# Patient Record
Sex: Female | Born: 1980 | Race: White | Hispanic: No | Marital: Married | Smoking: Never smoker
Health system: Southern US, Community
[De-identification: ages and names within clinical notes are randomized; demographics above are authoritative.]

## PROBLEM LIST (undated history)

## (undated) DIAGNOSIS — K5090 Crohn's disease, unspecified, without complications: Secondary | ICD-10-CM

## (undated) DIAGNOSIS — D689 Coagulation defect, unspecified: Secondary | ICD-10-CM

---

## 2012-07-03 DIAGNOSIS — K5090 Crohn's disease, unspecified, without complications: Secondary | ICD-10-CM | POA: Insufficient documentation

## 2014-04-07 ENCOUNTER — Emergency Department: Payer: Self-pay | Admitting: Emergency Medicine

## 2014-04-07 LAB — COMPREHENSIVE METABOLIC PANEL
ALT: 13 U/L (ref 12–78)
ANION GAP: 6 — AB (ref 7–16)
AST: 15 U/L (ref 15–37)
Albumin: 2.6 g/dL — ABNORMAL LOW (ref 3.4–5.0)
Alkaline Phosphatase: 72 U/L
BUN: 7 mg/dL (ref 7–18)
Bilirubin,Total: 0.2 mg/dL (ref 0.2–1.0)
CHLORIDE: 107 mmol/L (ref 98–107)
CO2: 23 mmol/L (ref 21–32)
Calcium, Total: 8.3 mg/dL — ABNORMAL LOW (ref 8.5–10.1)
Creatinine: 0.62 mg/dL (ref 0.60–1.30)
Glucose: 81 mg/dL (ref 65–99)
Osmolality: 269 (ref 275–301)
Potassium: 3.7 mmol/L (ref 3.5–5.1)
SODIUM: 136 mmol/L (ref 136–145)
Total Protein: 6.8 g/dL (ref 6.4–8.2)

## 2014-04-07 LAB — CBC WITH DIFFERENTIAL/PLATELET
Basophil #: 0 10*3/uL (ref 0.0–0.1)
Basophil %: 0.2 %
Eosinophil #: 0 10*3/uL (ref 0.0–0.7)
Eosinophil %: 0.2 %
HCT: 32.8 % — ABNORMAL LOW (ref 35.0–47.0)
HGB: 10.6 g/dL — AB (ref 12.0–16.0)
Lymphocyte #: 1 10*3/uL (ref 1.0–3.6)
Lymphocyte %: 11.6 %
MCH: 25.8 pg — ABNORMAL LOW (ref 26.0–34.0)
MCHC: 32.4 g/dL (ref 32.0–36.0)
MCV: 80 fL (ref 80–100)
MONOS PCT: 7.3 %
Monocyte #: 0.7 x10 3/mm (ref 0.2–0.9)
NEUTROS ABS: 7.2 10*3/uL — AB (ref 1.4–6.5)
Neutrophil %: 80.7 %
Platelet: 165 10*3/uL (ref 150–440)
RBC: 4.12 10*6/uL (ref 3.80–5.20)
RDW: 14 % (ref 11.5–14.5)
WBC: 9 10*3/uL (ref 3.6–11.0)

## 2014-04-07 LAB — URINALYSIS, COMPLETE
Bilirubin,UR: NEGATIVE
Blood: NEGATIVE
Glucose,UR: NEGATIVE mg/dL (ref 0–75)
KETONE: NEGATIVE
Nitrite: NEGATIVE
Ph: 5 (ref 4.5–8.0)
Protein: 30
RBC,UR: 1 /HPF (ref 0–5)
Specific Gravity: 1.023 (ref 1.003–1.030)

## 2014-04-07 LAB — LIPASE, BLOOD: Lipase: 196 U/L (ref 73–393)

## 2014-05-28 ENCOUNTER — Observation Stay: Payer: Self-pay | Admitting: Obstetrics and Gynecology

## 2014-05-28 LAB — CBC WITH DIFFERENTIAL/PLATELET
BANDS NEUTROPHIL: 3 %
Basophil #: 0 10*3/uL (ref 0.0–0.1)
Basophil %: 0.4 %
EOS ABS: 0 10*3/uL (ref 0.0–0.7)
EOS PCT: 0.5 %
HCT: 31 % — AB (ref 35.0–47.0)
HGB: 9.7 g/dL — ABNORMAL LOW (ref 12.0–16.0)
LYMPHS ABS: 0.9 10*3/uL — AB (ref 1.0–3.6)
LYMPHS PCT: 13.5 %
Lymphocytes: 14 %
MCH: 23.4 pg — ABNORMAL LOW (ref 26.0–34.0)
MCHC: 31.2 g/dL — AB (ref 32.0–36.0)
MCV: 75 fL — AB (ref 80–100)
Monocyte #: 0.5 x10 3/mm (ref 0.2–0.9)
Monocyte %: 7.2 %
Monocytes: 9 %
NEUTROS PCT: 78.4 %
Neutrophil #: 5.4 10*3/uL (ref 1.4–6.5)
Platelet: 122 10*3/uL — ABNORMAL LOW (ref 150–440)
RBC: 4.12 10*6/uL (ref 3.80–5.20)
RDW: 15.2 % — AB (ref 11.5–14.5)
Segmented Neutrophils: 74 %
WBC: 6.9 10*3/uL (ref 3.6–11.0)

## 2014-05-28 LAB — URINALYSIS, COMPLETE
BLOOD: NEGATIVE
Bacteria: NONE SEEN
Bilirubin,UR: NEGATIVE
Glucose,UR: NEGATIVE mg/dL (ref 0–75)
Ketone: NEGATIVE
Nitrite: NEGATIVE
Ph: 6 (ref 4.5–8.0)
Protein: NEGATIVE
Specific Gravity: 1.018 (ref 1.003–1.030)
Squamous Epithelial: 9

## 2014-05-28 LAB — COMPREHENSIVE METABOLIC PANEL
ALBUMIN: 2.6 g/dL — AB (ref 3.4–5.0)
ALT: 13 U/L (ref 12–78)
ANION GAP: 8 (ref 7–16)
AST: 10 U/L — AB (ref 15–37)
Alkaline Phosphatase: 123 U/L — ABNORMAL HIGH
BILIRUBIN TOTAL: 0.3 mg/dL (ref 0.2–1.0)
BUN: 7 mg/dL (ref 7–18)
Calcium, Total: 8.6 mg/dL (ref 8.5–10.1)
Chloride: 108 mmol/L — ABNORMAL HIGH (ref 98–107)
Co2: 24 mmol/L (ref 21–32)
Creatinine: 0.6 mg/dL (ref 0.60–1.30)
EGFR (Non-African Amer.): >60
GLUCOSE: 96 mg/dL (ref 65–99)
OSMOLALITY: 277 (ref 275–301)
POTASSIUM: 3.7 mmol/L (ref 3.5–5.1)
Sodium: 140 mmol/L (ref 136–145)
Total Protein: 6.7 g/dL (ref 6.4–8.2)

## 2014-05-30 LAB — URINE CULTURE

## 2014-06-03 ENCOUNTER — Ambulatory Visit: Payer: Self-pay | Admitting: Obstetrics and Gynecology

## 2014-06-03 LAB — CBC WITH DIFFERENTIAL/PLATELET
BASOS ABS: 0 10*3/uL (ref 0.0–0.1)
BASOS PCT: 0.3 %
Eosinophil #: 0.1 10*3/uL (ref 0.0–0.7)
Eosinophil %: 0.7 %
HCT: 31.7 % — ABNORMAL LOW (ref 35.0–47.0)
HGB: 9.9 g/dL — ABNORMAL LOW (ref 12.0–16.0)
LYMPHS ABS: 1.2 10*3/uL (ref 1.0–3.6)
Lymphocyte %: 15.9 %
MCH: 23.3 pg — AB (ref 26.0–34.0)
MCHC: 31.1 g/dL — ABNORMAL LOW (ref 32.0–36.0)
MCV: 75 fL — ABNORMAL LOW (ref 80–100)
MONO ABS: 0.7 x10 3/mm (ref 0.2–0.9)
Monocyte %: 8.8 %
NEUTROS PCT: 74.3 %
Neutrophil #: 5.7 10*3/uL (ref 1.4–6.5)
Platelet: 108 10*3/uL — ABNORMAL LOW (ref 150–440)
RBC: 4.24 10*6/uL (ref 3.80–5.20)
RDW: 15.3 % — AB (ref 11.5–14.5)
WBC: 7.7 10*3/uL (ref 3.6–11.0)

## 2014-06-04 ENCOUNTER — Inpatient Hospital Stay: Payer: Self-pay | Admitting: Obstetrics and Gynecology

## 2014-06-05 LAB — HEMATOCRIT: HCT: 27.9 % — AB (ref 35.0–47.0)

## 2014-06-06 LAB — PATHOLOGY REPORT

## 2015-03-08 NOTE — Op Note (Signed)
PATIENT NAME:  Taylor Hammond, Taylor Hammond MR#:  [id number] DATE OF BIRTH:  08-21-1981  DATE OF PROCEDURE:  06/04/2014  PREOPERATIVE DIAGNOSES:  1. A 39.0 week intrauterine pregnancy, undelivered.  2. History of previous cesarean section delivery.  3. Crohn disease.   POSTOPERATIVE DIAGNOSES:  1. A 39.0 week intrauterine pregnancy, delivered.  2. History of previous cesarean section delivery.  3. Crohn disease.  4. A viable female infant; 7 pounds, 13 ounces.  5. Leiomyoma uteri.   OPERATIVE PROCEDURES: Repeat low cervical transverse cesarean section and myomectomy.   SURGEON: Taylor Hammond. Taylor Hammond, M.D.   FIRST ASSISTANT: Taylor Lou, FNP.   ANESTHESIA: Spinal.   INDICATIONS: The patient is a 34 year old, married white female, gravida 2, para 1-0-0-1, at 39.0 weeks with an EDC of 06/11/2014, presents for repeat cesarean section delivery. Prenatal course had been notable for history of previous cesarean section delivery due to failure to progress. She also had a history of Crohn disease, which was stable during this pregnancy. CT scan previously done during pregnancy because of chest pain and shortness of breath to rule out pulmonary embolus.   FINDINGS AT SURGERY: Revealed a multifibroid uterus with at least 5 subserosal fibroids being seen in the uterine fundus and posterior lower uterine segment. Each of these fibroids was approximately 2 cm in diameter. There was an isolated subserosal fibroid near the uterine incision, which was excised.   DESCRIPTION OF PROCEDURE: The patient was brought to the operating room where she was placed in the sitting position. Spinal anesthetic was introduced without difficulty. She was placed in the supine position with a right lateral hip roll in place. A Foley catheter was placed. A ChloraPrep abdominal preparation and drape was performed in standard fashion. After checking for adequate level of anesthesia and following a timeout, the previous skin incision scar  was excised. The fascia was incised in the midline and extended bilaterally with Mayo scissors. Midline raphe was identified, incised and the peritoneum was entered. A bladder flap was created over the lower uterine segment through sharp dissection. Moderate scarring was encountered. The low transverse incision was made in the uterus and this was extended bilaterally, and with anterior and posterior retraction, the incision was enlarged. The infant was then delivered through a vertex presentation and was vigorous at birth. Oropharynx and nasopharynx were bulb suctioned on the anterior abdominal wall. Delivery was retained. The umbilical cord was doubly clamped and cut and the infant was handed off to the awaiting resuscitation team. The placenta was expressed from the uterine cavity. The uterus was externalized onto the anterior abdominal wall. It was cleared of all debris with laps.   The incision was closed in 2 layers using #1 chromic suture. The first layer was a running locking stitch. The second layer was an imbricating layer. The tubes and ovaries appeared grossly normal. Adhesions between the posterior lower uterine segment fibroids and omentum were taken down with the Bovie cautery. The lower uterine segment 3 cm fibroid, submucosa was resected near the uterine incision. A 2-0 chromic suture was used to optimize hemostasis. Once satisfied with hemostasis, the uterus was placed back into the abdominopelvic cavity. Gutters were cleared of all debris with laps. The incision was closed in layers using 0 Maxon in the fashion of simple running manner. The subcutaneous tissues were reapproximated using 2-0 Vicryl. The skin was closed with a subcuticular stitch of 4-0 Vicryl. Dermabond glue was placed over the incision and a pressure dressing was applied. A Lidoderm patch was also  applied. The patient was then awakened, mobilized, and taken to the recovery room in satisfactory condition.   ESTIMATED BLOOD LOSS:  500 mL.   INTRAVENOUS FLUIDS: 1200 mL.   URINE OUTPUT: 150 mL.  COUNTS: All instruments, needle, and sponge counts were verified as correct.   The patient did receive Ancef antibiotic prophylaxis.    ____________________________ Taylor Hammond Taylor Mowatt, MD mad:jr D: 06/04/2014 14:43:55 ET T: 06/04/2014 15:29:04 ET JOB#: [id number]  cc: Taylor Deutscher A. Pattricia Weiher, MD, <Dictator> [facility] Taylor Hammond Taylor Nierman MD ELECTRONICALLY SIGNED 06/05/2014 6:12

## 2015-03-19 ENCOUNTER — Ambulatory Visit
Admission: EM | Admit: 2015-03-19 | Discharge: 2015-03-19 | Disposition: A | Discharge status: Home / Self Care | Payer: No Typology Code available for payment source | Attending: Family Medicine | Admitting: Family Medicine

## 2015-03-19 DIAGNOSIS — M7662 Achilles tendinitis, left leg: Secondary | ICD-10-CM | POA: Diagnosis not present

## 2015-03-19 DIAGNOSIS — M722 Plantar fascial fibromatosis: Secondary | ICD-10-CM | POA: Diagnosis not present

## 2015-03-19 HISTORY — DX: Crohn's disease, unspecified, without complications: K50.90

## 2015-03-19 MED ORDER — MELOXICAM 15 MG PO TABS: 15.0000 mg | ORAL_TABLET | Freq: Every day | ORAL | Status: DC

## 2015-03-19 NOTE — ED Notes (Signed)
Patient states that she has left foot pain with swollen and tender in the heel. She states that this has been going on for a while but worsened last night.

## 2015-03-19 NOTE — Discharge Instructions (Signed)
Achilles Tendinitis Achilles tendinitis is inflammation of the tough, cord-like band that attaches the lower muscles of your leg to your heel (Achilles tendon). It is usually caused by overusing the tendon and joint involved.  CAUSES Achilles tendinitis can happen because of:  A sudden increase in exercise or activity (such as running).  Doing the same exercises or activities (such as jumping) over and over.  Not warming up calf muscles before exercising.  Exercising in shoes that are worn out or not made for exercise.  Having arthritis or a bone growth on the back of the heel bone. This can rub against the tendon and hurt the tendon. SIGNS AND SYMPTOMS The most common symptoms are:  Pain in the back of the leg, just above the heel. The pain usually gets worse with exercise and better with rest.  Stiffness or soreness in the back of the leg, especially in the morning.  Swelling of the skin over the Achilles tendon.  Trouble standing on tiptoe. Sometimes, an Achilles tendon tears (ruptures). Symptoms of an Achilles tendon rupture can include:  Sudden, severe pain in the back of the leg.  Trouble putting weight on the foot or walking normally. DIAGNOSIS Achilles tendinitis will be diagnosed based on symptoms and a physical examination. An X-ray may be done to check if another condition is causing your symptoms. An MRI may be ordered if your health care provider suspects you may have completely torn your tendon, which is called an Achilles tendon rupture.  TREATMENT  Achilles tendinitis usually gets better over time. It can take weeks to months to heal completely. Treatment focuses on treating the symptoms and helping the injury heal. HOME CARE INSTRUCTIONS   Rest your Achilles tendon and avoid activities that cause pain.  Apply ice to the injured area:  Put ice in a plastic bag.  Place a towel between your skin and the bag.  Leave the ice on for 20 minutes, 2-3 times a  day  Try to avoid using the tendon (other than gentle range of motion) while the tendon is painful. Do not resume use until instructed by your health care provider. Then begin use gradually. Do not increase use to the point of pain. If pain does develop, decrease use and continue the above measures. Gradually increase activities that do not cause discomfort until you achieve normal use.  Do exercises to make your calf muscles stronger and more flexible. Your health care provider or physical therapist can recommend exercises for you to do.  Wrap your ankle with an elastic bandage or other wrap. This can help keep your tendon from moving too much. Your health care provider will show you how to wrap your ankle correctly.  Only take over-the-counter or prescription medicines for pain, discomfort, or fever as directed by your health care provider. SEEK MEDICAL CARE IF:   Your pain and swelling increase or pain is uncontrolled with medicines.  You develop new, unexplained symptoms or your symptoms get worse.  You are unable to move your toes or foot.  You develop warmth and swelling in your foot.  You have an unexplained temperature. MAKE SURE YOU:   Understand these instructions.  Will watch your condition.  Will get help right away if you are not doing well or get worse. Document Released: 08/11/2005 Document Revised: 08/22/2013 Document Reviewed: 06/13/2013 [employer] Patient Information 2015 [employer]. This information is not intended to replace advice given to you by your health care provider. Make sure you discuss   any questions you have with your health care provider.  

## 2015-03-19 NOTE — ED Provider Notes (Signed)
CSN: [id number]     Arrival date & time 03/19/15  1214 History   First MD Initiated Contact with Patient 03/19/15 1320     Chief Complaint  Patient presents with  . Foot Pain   (Consider location/radiation/quality/duration/timing/severity/associated sxs/prior Treatment) Patient is a 34 y.o. female presenting with lower extremity pain. The history is provided by the patient.  Foot Pain This is a new problem. The current episode started more than 1 week ago. The problem occurs constantly. The problem has been rapidly worsening. The symptoms are aggravated by walking and standing. Nothing relieves the symptoms. She has tried a cold compress and rest for the symptoms.   Presents with left foot and Achille's tendon pain radiating into her knee posteriorly. She has been exercising hard to get into shape since delivering her 2nd child. She had been exercising for 3 weeks when she began to have the pain. About 3 days ago she bought insoles which seemed to help at first, but last night it was worse. Now limping.  She stopped breast feeding 3 days ago. She is not planning to return to breast feeding. Past Medical History  Diagnosis Date  . Crohn's disease    Past Surgical History  Procedure Laterality Date  . Cesarean section      X 2   No family history on file. History  Substance Use Topics  . Smoking status: Never Smoker   . Smokeless tobacco: Not on file  . Alcohol Use: No   OB History    No data available     Review of Systems  All other systems reviewed and are negative.   Allergies  Codeine and Dairy aid  Home Medications   Prior to Admission medications   Medication Sig Start Date End Date Taking? Authorizing Provider  meloxicam (MOBIC) 15 MG tablet Take 1 tablet (15 mg total) by mouth daily. 03/19/15   Chrissie Noa Roemer, PA-C   BP 123/79 mmHg  Pulse 97  Temp(Src) 98.5 F (36.9 C) (Tympanic)  Resp 18  Ht  (1.753 m)  Wt 195 lb (88.451 kg)  BMI 28.78 kg/m2  SpO2 97%   LMP 03/10/2015 Physical Exam  Constitutional: She is oriented to person, place, and time. She appears well-developed and well-nourished.  HENT:  Head: Normocephalic and atraumatic.  Eyes: EOM are normal. Pupils are equal, round, and reactive to light.  Neck: Normal range of motion. Neck supple.  Musculoskeletal:  Left leg is mildly swollen. Maximal tenderness is sharply localized over the Achille's insertion and the plantar fascia along the Flexor hallux. Mild swelling of popliteal fossa..   Neurological: She is alert and oriented to person, place, and time. She has normal reflexes.  Skin: Skin is warm and dry.  Psychiatric: She has a normal mood and affect. Her behavior is normal. Judgment and thought content normal.  Nursing note and vitals reviewed.   ED Course  Procedures (including critical care time) Labs Review Labs Reviewed - No data to display  Imaging Review No results found.   MDM   1. Achilles tendinitis of left lower extremity   2. Plantar fasciitis of left foot        Lutricia Feil, PA-C 03/19/15 1408

## 2015-07-09 ENCOUNTER — Encounter: Payer: Self-pay | Admitting: Emergency Medicine

## 2015-07-09 ENCOUNTER — Ambulatory Visit
Admission: EM | Admit: 2015-07-09 | Discharge: 2015-07-09 | Disposition: A | Discharge status: Home / Self Care | Payer: No Typology Code available for payment source | Attending: Family Medicine | Admitting: Family Medicine

## 2015-07-09 DIAGNOSIS — M2550 Pain in unspecified joint: Secondary | ICD-10-CM

## 2015-07-09 DIAGNOSIS — R21 Rash and other nonspecific skin eruption: Secondary | ICD-10-CM

## 2015-07-09 LAB — COMPREHENSIVE METABOLIC PANEL
ALK PHOS: 68 U/L (ref 38–126)
ALT: 20 U/L (ref 14–54)
AST: 23 U/L (ref 15–41)
Albumin: 4.3 g/dL (ref 3.5–5.0)
Anion gap: 7 (ref 5–15)
BUN: 17 mg/dL (ref 6–20)
CALCIUM: 9.2 mg/dL (ref 8.9–10.3)
CO2: 28 mmol/L (ref 22–32)
CREATININE: 0.67 mg/dL (ref 0.44–1.00)
Chloride: 104 mmol/L (ref 101–111)
GFR calc non Af Amer: >60 mL/min (ref >60)
Glucose, Bld: 93 mg/dL (ref 65–99)
Potassium: 4 mmol/L (ref 3.5–5.1)
SODIUM: 139 mmol/L (ref 135–145)
Total Bilirubin: 0.3 mg/dL (ref 0.3–1.2)
Total Protein: 8.3 g/dL — ABNORMAL HIGH (ref 6.5–8.1)

## 2015-07-09 LAB — CBC WITH DIFFERENTIAL/PLATELET
BASOS ABS: 0.1 10*3/uL (ref 0–0.1)
Basophils Relative: 1 %
EOS ABS: 0.1 10*3/uL (ref 0–0.7)
EOS PCT: 3 %
HCT: 34.5 % — ABNORMAL LOW (ref 35.0–47.0)
HEMOGLOBIN: 10.7 g/dL — AB (ref 12.0–16.0)
LYMPHS ABS: 1 10*3/uL (ref 1.0–3.6)
Lymphocytes Relative: 18 %
MCH: 21.6 pg — AB (ref 26.0–34.0)
MCHC: 30.9 g/dL — ABNORMAL LOW (ref 32.0–36.0)
MCV: 69.8 fL — ABNORMAL LOW (ref 80.0–100.0)
Monocytes Absolute: 0.7 10*3/uL (ref 0.2–0.9)
Monocytes Relative: 12 %
NEUTROS PCT: 66 %
Neutro Abs: 3.8 10*3/uL (ref 1.4–6.5)
Platelets: 218 10*3/uL (ref 150–440)
RBC: 4.94 MIL/uL (ref 3.80–5.20)
RDW: 16.9 % — ABNORMAL HIGH (ref 11.5–14.5)
WBC: 5.7 10*3/uL (ref 3.6–11.0)

## 2015-07-09 LAB — SEDIMENTATION RATE: Sed Rate: 58 mm/hr — ABNORMAL HIGH (ref 0–20)

## 2015-07-09 LAB — C-REACTIVE PROTEIN: CRP: 2.2 mg/dL — ABNORMAL HIGH (ref <1.0)

## 2015-07-09 LAB — URIC ACID: Uric Acid, Serum: 4.5 mg/dL (ref 2.3–6.6)

## 2015-07-09 MED ORDER — TRAMADOL HCL 50 MG PO TABS: 50.0000 mg | ORAL_TABLET | Freq: Three times a day (TID) | ORAL | Status: DC | PRN

## 2015-07-09 MED ORDER — DOXYCYCLINE HYCLATE 100 MG PO CAPS: 100.0000 mg | ORAL_CAPSULE | Freq: Two times a day (BID) | ORAL | Status: DC

## 2015-07-09 NOTE — ED Provider Notes (Signed)
[facility] Emergency Department Provider Note  ____________________________________________  Time seen: Approximately 10:47 AM  I have reviewed the triage vital signs and the nursing notes.   HISTORY  Chief Complaint Joint Pain   HPI Taylor Hammond is a 34 y.o. female presents for the complaints of joint pain. Patient reports that for the last 4 months she has had intermittent joint pain in her feet, ankles, knees, hands, wrist. Patient states that often she has pain in other joints as well, stating "all my joints". Patient reports that she was diagnosed with plantar fasciitis approximate 4 months ago and states that that has improved but she continues with joint pain. States that when she had plantar fasciitis she was taking Mobic which helped. States that she had stopped taking the mobic for a few months. States 3 weeks ago she took another Mobic which helped with her pain but states that the next morning she awoke with a rash. States that the rash started out like hives and was generalized. Patient states that the rash was itchy. States that after she stopped taking the Mobic the rash stopped appearing. States she tends to pick at scabs which is why she still has some areas on her forearms and legs. Denies new rash since stopping mobic 2 weeks ago.   States continues with joint pains. States today pain is primarily in hands and feet and knees. States occasionally feels like joints are swollen but does not appear to be swollen. Denies fever, fall, injury, recent sickness. Denies recent known tick or insect bites, but states has pulled multiple ticks off her dogs. Denies known tick or insect bites.  Reports she continues to remain active. States other than the pain she feels well. Reports continues to eat and drink well. States current pain is rated at 4 out of 10 and described as aching. States she takes Aleve daily which helps with pain.  Denies fall or injury.      Past Medical History  Diagnosis Date  . Crohn's disease   Anemia (reports vitamins with iron at home, states has not been taking daily)  There are no active problems to display for this patient.   Past Surgical History  Procedure Laterality Date  . Cesarean section      X 2    Current Outpatient Rx  Name  Route  Sig  Dispense  Refill  . meloxicam (MOBIC) 15 MG tablet   Oral   Take 1 tablet (15 mg total) by mouth daily.   30 tablet   0    Last Menstrual: 2 weeks ago. States not pregnant and not currently sexually active.   Allergies Codeine and Dairy aid  History reviewed. No pertinent family history.  Social History Social History  Substance Use Topics  . Smoking status: Never Smoker   . Smokeless tobacco: None  . Alcohol Use: 0.0 oz/week    0 Standard drinks or equivalent per week    Review of Systems Constitutional: No fever/chills Eyes: No visual changes. ENT: No sore throat. Cardiovascular: Denies chest pain. Respiratory: Denies shortness of breath. Gastrointestinal: No abdominal pain.  No nausea, no vomiting.  No diarrhea.  No constipation. Denies dark stools, bloody or tarry stools.  Genitourinary: Negative for dysuria. Musculoskeletal: Negative for back pain.positive for generalized joint pains.  Skin: positive for rash as above.  Neurological: Negative for headaches, focal weakness or numbness.Denies dizziness.   10-point ROS otherwise negative.  ____________________________________________   PHYSICAL EXAM:  VITAL SIGNS: ED  Triage Vitals  Enc Vitals Group     BP 07/09/15 1015 110/72 mmHg     Pulse Rate 07/09/15 1015 90     Resp 07/09/15 1015 18     Temp 07/09/15 1015 97.9 F (36.6 C)     Temp Source 07/09/15 1015 Tympanic     SpO2 07/09/15 1015 99 %     Weight 07/09/15 1015 197 lb (89.359 kg)     Height 07/09/15 1015 5\' 9"  (1.753 m)     Head Cir --      Peak Flow --      Pain Score 07/09/15 1017 9     Pain Loc --      Pain Edu?  --      Excl. in GC? --     Constitutional: Alert and oriented. Well appearing and in no acute distress. Eyes: Conjunctivae are normal. PERRL. EOMI. Head: Atraumatic.  Nose: No congestion/rhinnorhea.  Mouth/Throat: Mucous membranes are moist.  Oropharynx non-erythematous. Neck: No stridor.  No cervical spine tenderness to palpation. Hematological/Lymphatic/Immunilogical: No cervical lymphadenopathy. Cardiovascular: Normal rate, regular rhythm. Grossly normal heart sounds.  Good peripheral circulation. Respiratory: Normal respiratory effort.  No retractions. Lungs CTAB. Gastrointestinal: Soft and nontender. No distention. Normal Bowel sounds.  No abdominal bruits. No CVA tenderness. Musculoskeletal: No lower or upper extremity tenderness nor edema.  No joint effusions. Bilateral pedal pulses equal and easily palpated. 5/5 strength to bilateral upper and lower extremities. Full ROM. Motor and sensation intact to bilateral upper and lower extremities. No edema to upper or lower extremities noted. Steady Gait. Changes positions from lying to standing quickly without difficulty or distress. Right foot first MCP mild TTP, right hand second and third MCP mild TTP. No erythema, no signs of infection.  Neurologic:  Normal speech and language. No gross focal neurologic deficits are appreciated. No gait instability. Skin:  Skin is warm, dry and intact. Bilateral forearms and lower anterior bilateral legs with multiple nonpruritic, non erythematic rash and scaring. Rash is macular small  Psychiatric: Mood and affect are normal. Speech and behavior are normal.  ____________________________________________   LABS (all labs ordered are listed, but only abnormal results are displayed)  Labs Reviewed  CBC WITH DIFFERENTIAL/PLATELET - Abnormal; Notable for the following:    Hemoglobin 10.7 (*)    HCT 34.5 (*)    MCV 69.8 (*)    MCH 21.6 (*)    MCHC 30.9 (*)    RDW 16.9 (*)    All other components  within normal limits  COMPREHENSIVE METABOLIC PANEL - Abnormal; Notable for the following:    Total Protein 8.3 (*)    All other components within normal limits  SEDIMENTATION RATE - Abnormal; Notable for the following:    Sed Rate 58 (*)    All other components within normal limits  URIC ACID  ROCKY MTN SPOTTED FVR ABS PNL(IGG+IGM)  C-REACTIVE PROTEIN  B. BURGDORFI ANTIBODIES   _________________________________________   INITIAL IMPRESSION / ASSESSMENT AND PLAN / ED COURSE  Pertinent labs & imaging results that were available during my care of the patient were reviewed by me and considered in my medical decision making (see chart for details).  Very well appearing patient. No acute distress. Presents for the complaints of 4 months of intermittent varying joint pain. Patient reports that it varies between upper and lower extremities. Denies neck or back pain. Denies fall or injury. Denies fever or headache. Patient reports that she feels well other than intermittent pain. Patient also  reports rash onset 3 weeks ago which is now resolving. Patient states that no new rash or lesions in 2 weeks. States she thinks rash was due to Mobic and will avoid Mobic . Denies continuing pruritus. Denies known tick bites or insect bites, but states that she has pulled ticks off of her dogs.   Discussed patient and plan of care with Dr. Concha Se who also saw patient and reviewed results. Chronic anemia, per patient similar hemoglobin level as her past. Elevated sed rate, concern for autoimmune disorder such as rheumatoid arthritis. Patient also with potential tick exposure and with complaint of recent rash and arthralgias, will send for tick related labs and treat with doxycycline. Discussed and counseled regarding skin photosensitivity and pregnancy risks with doxycycline. PRN tramadol for pain.Encouraged patient to continued home multivitamins with iron and follow up with PCP. Discussed very strict follow up  and return parameters. Patient to follow up with PCP next week as well as follow up with rheumatology next week. Discussed strict return parameters. Patient verbalized understanding and agreed to plan. Discussed go to ER for fever, increased pain, rash or other concerns.     ____________________________________________   FINAL CLINICAL IMPRESSION(S) / ED DIAGNOSES  Final diagnoses:  Joint pain  Rash       Renford Dills, NP 07/09/15 1342   Renford Dills, NP 07/09/15 1349

## 2015-07-09 NOTE — Discharge Instructions (Signed)
Take medication as prescribed. Rest. Drink plenty of fluids.  As discussed, take daily vitamins with iron and eat healthy diet. Follow up with your primary care physician next week and for further follow-up of your chronic anemia.  Also as discussed follow-up within 1 week with rheumatology. See above to call today to schedule.  Return to the urgent care or go to the ER for fever, increased pain, new or worsening concerns.  Arthralgia Your caregiver has diagnosed you as suffering from an arthralgia. Arthralgia means there is pain in a joint. This can come from many reasons including:  Bruising the joint which causes soreness (inflammation) in the joint.  Wear and tear on the joints which occur as we grow older (osteoarthritis).  Overusing the joint.  Various forms of arthritis.  Infections of the joint. Regardless of the cause of pain in your joint, most of these different pains respond to anti-inflammatory drugs and rest. The exception to this is when a joint is infected, and these cases are treated with antibiotics, if it is a bacterial infection. HOME CARE INSTRUCTIONS   Rest the injured area for as long as directed by your caregiver. Then slowly start using the joint as directed by your caregiver and as the pain allows. Crutches as directed may be useful if the ankles, knees or hips are involved. If the knee was splinted or casted, continue use and care as directed. If an stretchy or elastic wrapping bandage has been applied today, it should be removed and re-applied every 3 to 4 hours. It should not be applied tightly, but firmly enough to keep swelling down. Watch toes and feet for swelling, bluish discoloration, coldness, numbness or excessive pain. If any of these problems (symptoms) occur, remove the ace bandage and re-apply more loosely. If these symptoms persist, contact your caregiver or return to this location.  For the first 24 hours, keep the injured extremity elevated on  pillows while lying down.  Apply ice for 15-20 minutes to the sore joint every couple hours while awake for the first half day. Then 03-04 times per day for the first 48 hours. Put the ice in a plastic bag and place a towel between the bag of ice and your skin.  Wear any splinting, casting, elastic bandage applications, or slings as instructed.  Only take over-the-counter or prescription medicines for pain, discomfort, or fever as directed by your caregiver. Do not use aspirin immediately after the injury unless instructed by your physician. Aspirin can cause increased bleeding and bruising of the tissues.  If you were given crutches, continue to use them as instructed and do not resume weight bearing on the sore joint until instructed. Persistent pain and inability to use the sore joint as directed for more than 2 to 3 days are warning signs indicating that you should see a caregiver for a follow-up visit as soon as possible. Initially, a hairline fracture (break in bone) may not be evident on X-rays. Persistent pain and swelling indicate that further evaluation, non-weight bearing or use of the joint (use of crutches or slings as instructed), or further X-rays are indicated. X-rays may sometimes not show a small fracture until a week or 10 days later. Make a follow-up appointment with your own caregiver or one to whom we have referred you. A radiologist (specialist in reading X-rays) may read your X-rays. Make sure you know how you are to obtain your X-ray results. Do not assume everything is normal if you do not hear  from [place]. SEEK MEDICAL CARE IF: Bruising, swelling, or pain increases. SEEK IMMEDIATE MEDICAL CARE IF:   Your fingers or toes are numb or blue.  The pain is not responding to medications and continues to stay the same or get worse.  The pain in your joint becomes severe.  You develop a fever over 102 F (38.9 C).  It becomes impossible to move or use the joint. MAKE SURE YOU:     Understand these instructions.  Will watch your condition.  Will get help right away if you are not doing well or get worse. Document Released: 11/01/2005 Document Revised: 01/24/2012 Document Reviewed: 06/19/2008 [facility] Patient Information 2015 [place], [place]. This information is not intended to replace advice given to you by your health care provider. Make sure you discuss any questions you have with your health care provider.  Rash A rash is a change in the color or texture of your skin. There are many different types of rashes. You may have other problems that accompany your rash. CAUSES   Infections.  Allergic reactions. This can include allergies to pets or foods.  Certain medicines.  Exposure to certain chemicals, soaps, or cosmetics.  Heat.  Exposure to poisonous plants.  Tumors, both cancerous and noncancerous. SYMPTOMS   Redness.  Scaly skin.  Itchy skin.  Dry or cracked skin.  Bumps.  Blisters.  Pain. DIAGNOSIS  Your caregiver may do a physical exam to determine what type of rash you have. A skin sample (biopsy) may be taken and examined under a microscope. TREATMENT  Treatment depends on the type of rash you have. Your caregiver may prescribe certain medicines. For serious conditions, you may need to see a skin doctor (dermatologist). HOME CARE INSTRUCTIONS   Avoid the substance that caused your rash.  Do not scratch your rash. This can cause infection.  You may take cool baths to help stop itching.  Only take over-the-counter or prescription medicines as directed by your caregiver.  Keep all follow-up appointments as directed by your caregiver. SEEK IMMEDIATE MEDICAL CARE IF:  You have increasing pain, swelling, or redness.  You have a fever.  You have new or severe symptoms.  You have body aches, diarrhea, or vomiting.  Your rash is not better after 3 days. MAKE SURE YOU:  Understand these instructions.  Will watch your  condition.  Will get help right away if you are not doing well or get worse. Document Released: 10/22/2002 Document Revised: 01/24/2012 Document Reviewed: 08/16/2011 [facility] Patient Information 2015 [place], [place]. This information is not intended to replace advice given to you by your health care provider. Make sure you discuss any questions you have with your health care provider.

## 2015-07-09 NOTE — ED Notes (Signed)
Pt with joint pain and swelling and a rash on body x weeks

## 2015-07-10 ENCOUNTER — Ambulatory Visit (INDEPENDENT_AMBULATORY_CARE_PROVIDER_SITE_OTHER): Payer: No Typology Code available for payment source | Admitting: Family Medicine

## 2015-07-10 VITALS — BP 110/60 | HR 96 | Ht 69.0 in | Wt 199.6 lb

## 2015-07-10 DIAGNOSIS — D509 Iron deficiency anemia, unspecified: Secondary | ICD-10-CM

## 2015-07-10 DIAGNOSIS — M130 Polyarthritis, unspecified: Secondary | ICD-10-CM

## 2015-07-10 DIAGNOSIS — M159 Polyosteoarthritis, unspecified: Secondary | ICD-10-CM

## 2015-07-10 DIAGNOSIS — K5090 Crohn's disease, unspecified, without complications: Secondary | ICD-10-CM

## 2015-07-10 DIAGNOSIS — R21 Rash and other nonspecific skin eruption: Secondary | ICD-10-CM | POA: Diagnosis not present

## 2015-07-10 LAB — B. BURGDORFI ANTIBODIES: B burgdorferi Ab IgG+IgM: <0.91 {ISR} (ref 0.00–0.90)

## 2015-07-10 LAB — ROCKY MTN SPOTTED FVR ABS PNL(IGG+IGM)
RMSF IGG: NEGATIVE
RMSF IgM: 0.55 index (ref 0.00–0.89)

## 2015-07-10 NOTE — Progress Notes (Signed)
Date:  07/10/2015   Name:  Taylor Hammond   DOB:  1981/09/28   MRN:  [id number]  PCP:  No PCP Per Patient    Chief Complaint: Establish Care and Referral   History of Present Illness:  This is a 34 y.o. female with hx Fe deficiency, Crohn's disease, and lactose intolerance c/o 4 month hx of intermittent toe and finger pain and edema which has worsened over past 2 weeks and now involves knees, elbows, and shoulders. Two weeks started on Mobic but developed BUE and BLE rash so stopped Mobic and rash has improved but not resolved. Seen [facility] yesterday where blood work showed microcytic anemia (chronic per pt) and elevated ESR and CRP. Pt says Lyme dz and RMSF tests also sent but not back yet. Given rx for doxy but has not started because they told her not infection.   Review of Systems:  Review of Systems  Constitutional: Negative for fever and fatigue.  Respiratory: Negative for cough and shortness of breath.   Cardiovascular: Negative for chest pain.  Gastrointestinal: Negative for abdominal pain.  Neurological: Negative for headaches.  Hematological: Negative for adenopathy.  Psychiatric/Behavioral: Negative for confusion.    Patient Active Problem List   Diagnosis Date Noted  . CD (Crohn's disease) 07/03/2012    Prior to Admission medications   Medication Sig Start Date End Date Taking? Authorizing Provider  doxycycline (VIBRAMYCIN) 100 MG capsule Take 1 capsule (100 mg total) by mouth 2 (two) times daily. Patient not taking: Reported on 07/10/2015 07/09/15   Marylene Land, NP  traMADol (ULTRAM) 50 MG tablet Take 1 tablet (50 mg total) by mouth every 8 (eight) hours as needed (Do not drive or operate machinery while taking as can cause drowsiness.). Patient not taking: Reported on 07/10/2015 07/09/15   Marylene Land, NP    Allergies  Allergen Reactions  . Codeine   . Dairy Aid [Lactase]     Past Surgical History  Procedure Laterality Date  . Cesarean section      X 2     Social History  Substance Use Topics  . Smoking status: Never Smoker   . Smokeless tobacco: Not on file  . Alcohol Use: 0.0 oz/week    0 Standard drinks or equivalent per week    No family history on file.  Medication list has been reviewed and updated.  Physical Examination: BP 110/60 mmHg  Pulse 96  Ht _0  (1.753 m)  Wt 199 lb 9.6 oz (90.538 kg)  BMI 29.46 kg/m2  LMP 06/20/2015  Physical Exam  Constitutional: She is oriented to person, place, and time. She appears well-developed and well-nourished. No distress.  Cardiovascular: Normal rate, regular rhythm and normal heart sounds.   Pulmonary/Chest: Effort normal and breath sounds normal.  Musculoskeletal:  Mild edema affecting B fingers and toes, joints moderately tender but no significant erythema or warmth, ankles and wrist also with mild edema but nontender  Neurological: She is alert and oriented to person, place, and time. Coordination normal.  Skin: Skin is warm and dry.  Scattered eczematous erythematous rash over BUE and BLE  Psychiatric: She has a normal mood and affect. Her behavior is normal.    Assessment and Plan:  1. Polyarthropathy Unclear etiology, infectious vs. inflammatory (hx Crohn's dz), recommend take doxy as prescribed and refer rheum for further evalution - Ambulatory referral to Rheumatology  2. Rash Unclear etiology, infectious vs. inflammatory vs. Mobic rxn. Ezcema, scabies less likely   3. Fe def anemia Chronic  per pt, restart usual Fe supplement  4. Crohn dz Asymptomatic at present  Return in about 4 weeks (around 08/07/2015).  Satira Anis. [facility]  07/11/2015

## 2015-07-11 ENCOUNTER — Encounter: Payer: Self-pay | Admitting: Family Medicine

## 2015-07-11 DIAGNOSIS — D509 Iron deficiency anemia, unspecified: Secondary | ICD-10-CM | POA: Insufficient documentation

## 2015-07-11 MED ORDER — FERROUS SULFATE 325 (65 FE) MG PO TBEC: 325.0000 mg | DELAYED_RELEASE_TABLET | Freq: Every day | ORAL | Status: DC

## 2015-07-15 ENCOUNTER — Telehealth: Payer: Self-pay

## 2015-07-15 NOTE — Progress Notes (Signed)
Discussed with Helene Kelp nurse practitioner who is referred patient to rheumatologist. No further actions should be needed.

## 2015-07-15 NOTE — ED Notes (Signed)
Patient called for result of Lyme Disease and RMSF. Both tests negative. States has an appointment with Dr. Lin Givens tomorrow August 31.

## 2015-08-13 ENCOUNTER — Inpatient Hospital Stay: Payer: No Typology Code available for payment source | Attending: Oncology | Admitting: Oncology

## 2015-08-13 ENCOUNTER — Ambulatory Visit: Payer: Self-pay | Admitting: Oncology

## 2015-08-13 VITALS — BP 112/72 | HR 112 | Temp 96.6°F | Resp 20 | Ht 69.0 in | Wt 198.9 lb

## 2015-08-13 DIAGNOSIS — Z7982 Long term (current) use of aspirin: Secondary | ICD-10-CM | POA: Insufficient documentation

## 2015-08-13 DIAGNOSIS — Z7901 Long term (current) use of anticoagulants: Secondary | ICD-10-CM | POA: Diagnosis not present

## 2015-08-13 DIAGNOSIS — D649 Anemia, unspecified: Secondary | ICD-10-CM | POA: Diagnosis not present

## 2015-08-13 DIAGNOSIS — D6861 Antiphospholipid syndrome: Secondary | ICD-10-CM | POA: Insufficient documentation

## 2015-08-13 DIAGNOSIS — Z79899 Other long term (current) drug therapy: Secondary | ICD-10-CM | POA: Insufficient documentation

## 2015-08-13 DIAGNOSIS — R0781 Pleurodynia: Secondary | ICD-10-CM | POA: Insufficient documentation

## 2015-08-13 DIAGNOSIS — K5090 Crohn's disease, unspecified, without complications: Secondary | ICD-10-CM | POA: Diagnosis not present

## 2015-08-13 MED ORDER — APIXABAN 5 MG PO TABS: 5.0000 mg | ORAL_TABLET | Freq: Two times a day (BID) | ORAL | Status: DC

## 2015-08-13 MED ORDER — APIXABAN 5 MG PO TABS: 10.0000 mg | ORAL_TABLET | Freq: Two times a day (BID) | ORAL | Status: DC

## 2015-08-13 NOTE — Progress Notes (Signed)
Patient here today for initial evaluation regarding blood clot, need for anticoagulation.

## 2015-08-18 MED ORDER — RIVAROXABAN 20 MG PO TABS: 20.0000 mg | ORAL_TABLET | Freq: Every day | ORAL | Status: DC

## 2015-08-18 MED ORDER — RIVAROXABAN (XARELTO) VTE STARTER PACK (15 & 20 MG): ORAL_TABLET | ORAL | Status: DC

## 2015-08-24 NOTE — Progress Notes (Signed)
[facility]  [phone] [phone]  ID: Taylor Hammond OB: 1981/09/18  MR#: [id number]  AOZ#:[id number]  Patient Care Team: No Pcp Per Patient as PCP - General (General Practice)  CHIEF COMPLAINT:  Chief Complaint  Patient presents with  . New Evaluation    anticoagulation    INTERVAL HISTORY: Patient is a 34 year old female with a history of Crohn's disease who developed a rash on her arms and torso. Subsequent workup and biopsy revealed extensive thrombosis of the medium to large specials within the deep dermis consistent with livedo reticularis and anti-phospholipid syndrome. Subsequent laboratory workup was also consistent with anti-phospholipid syndrome. She does not have a personal history of DVT or PE. She has some pleuritic chest pain, but otherwise feels well. She has no neurologic complaints. She denies any recent fevers or illnesses. She has a good appetite and denies weight loss. She denies any shortness of breath or hemoptysis. She has no nausea, vomiting, constipation, or diarrhea. She has no urinary complaints. Patient otherwise feels well and offers no further specific complaints.  REVIEW OF SYSTEMS:   Review of Systems  Constitutional: Negative for fever, weight loss and malaise/fatigue.  Respiratory: Negative.   Cardiovascular: Positive for chest pain.  Gastrointestinal: Negative.  Negative for blood in stool and melena.  Genitourinary: Negative.   Musculoskeletal: Negative.   Skin: Positive for rash.  Neurological: Negative.  Negative for weakness.    As per HPI. Otherwise, a complete review of systems is negatve.  PAST MEDICAL HISTORY: Past Medical History  Diagnosis Date  . Crohn's disease     PAST SURGICAL HISTORY: Past Surgical History  Procedure Laterality Date  . Cesarean section      X 2    FAMILY HISTORY: Brother with pulmonary embolism at age 65 of unknown etiology.      ADVANCED DIRECTIVES:     HEALTH MAINTENANCE: Social History  Substance Use Topics  . Smoking status: Never Smoker   . Smokeless tobacco: Not on file  . Alcohol Use: 0.0 oz/week    0 Standard drinks or equivalent per week     Colonoscopy:  PAP:  Bone density:  Lipid panel:  Allergies  Allergen Reactions  . Codeine   . Dairy Aid [Lactase]     Current Outpatient Prescriptions  Medication Sig Dispense Refill  . aspirin 81 MG tablet Take 81 mg by mouth daily.    . hydroxychloroquine (PLAQUENIL) 200 MG tablet Take 200 mg by mouth daily.    Marland Kitchen doxycycline (VIBRAMYCIN) 100 MG capsule Take 1 capsule (100 mg total) by mouth 2 (two) times daily. (Patient not taking: Reported on 07/10/2015) 20 capsule 0  . ferrous sulfate 325 (65 FE) MG EC tablet Take 1 tablet (325 mg total) by mouth daily. (Patient not taking: Reported on 08/13/2015) 30 tablet 0  . Rivaroxaban (XARELTO STARTER PACK) 15 & 20 MG TBPK Take as directed on package: Start with one  tablet by mouth twice a day with food. On Day 22, switch to one  tablet once a day with food. 51 each 0  . rivaroxaban (XARELTO) 20 MG TABS tablet Take 1 tablet (20 mg total) by mouth daily with supper. Initiate after completion of starter pack. 30 tablet 5  . traMADol (ULTRAM) 50 MG tablet Take 1 tablet (50 mg total) by mouth every 8 (eight) hours as needed (Do not drive or operate machinery while taking as can cause drowsiness.). (Patient not taking: Reported on 07/10/2015) 12 tablet 0   No  current facility-administered medications for this visit.    OBJECTIVE: Filed Vitals:   08/13/15 0933  BP: 112/72  Pulse: 112  Temp: 96.6 F (35.9 C)  Resp: 20     Body mass index is 29.35 kg/(m^2).    ECOG FS:0 - Asymptomatic  General: Well-developed, well-nourished, no acute distress. Eyes: Pink conjunctiva, anicteric sclera. HEENT: Normocephalic, moist mucous membranes, clear oropharnyx. Lungs: Clear to auscultation bilaterally. Heart: Regular rate and rhythm. No  rubs, murmurs, or gallops. Abdomen: Soft, nontender, nondistended. No organomegaly noted, normoactive bowel sounds. Musculoskeletal: No edema, cyanosis, or clubbing. Neuro: Alert, answering all questions appropriately. Cranial nerves grossly intact. Skin: Rash consistent with livedo reticularis. Psych: Normal affect. Lymphatics: No cervical, calvicular, axillary or inguinal LAD.   LAB RESULTS:  Lab Results  Component Value Date   NA 139 07/09/2015   K 4.0 07/09/2015   CL 104 07/09/2015   CO2 28 07/09/2015   GLUCOSE 93 07/09/2015   BUN 17 07/09/2015   CREATININE 0.67 07/09/2015   CALCIUM 9.2 07/09/2015   PROT 8.3* 07/09/2015   ALBUMIN 4.3 07/09/2015   AST 23 07/09/2015   ALT 20 07/09/2015   ALKPHOS 68 07/09/2015   BILITOT 0.3 07/09/2015   GFRNONAA >60 07/09/2015   GFRAA >60 07/09/2015    Lab Results  Component Value Date   WBC 5.7 07/09/2015   NEUTROABS 3.8 07/09/2015   HGB 10.7* 07/09/2015   HCT 34.5* 07/09/2015   MCV 69.8* 07/09/2015   PLT 218 07/09/2015   Anticardiolipin antibody IgG 117, anticardiolipin antibody IgM 14, anticardiolipin antibody IgA 37. Beta-2 glycoprotein antibody IgG 102, beta-2 glycoprotein antibody IgA 65, beta-2 glycoprotein antibody IgM.  STUDIES: No results found.  ASSESSMENT: Anti-phospholipid syndrome  PLAN:    1. Anti-phospholipid syndrome: Patient has positive anticardiolipin antibodies as well as significantly elevated beta-2 glycoprotein antibodies. 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  She does not have evidence of SLE. Although pregnancy complications are frequent with APS, patient has had 2 children without complication. She is on hydroxychloroquine, but benefit in APS is unclear.   Patient was given a prescription for Xarelto (Eliquis was denied by insurance) which she will take for at minimum 3  months. Patient will return to clinic in December at which time we will do a full hypercoagulable workup including repeat anticardiolipin and beta-2 glycoprotein antibodies to determine whether patient should remain on lifelong anticoagulation.  2. Anemia: Given patient's recent MCV and history of Crohn's, will get iron stores with next lab draw. Patient may benefit from IV Feraheme in the future.   Approximately 60 minutes was spent in discussion and consultation.  Patient expressed understanding and was in agreement with this plan. She also understands that She can call clinic at any time with any questions, concerns, or complaints.    Jeralyn Ruths, MD   08/24/2015 10:04 AM

## 2015-09-05 ENCOUNTER — Encounter: Payer: Self-pay | Admitting: Emergency Medicine

## 2015-09-05 ENCOUNTER — Emergency Department
Admission: EM | Admit: 2015-09-05 | Discharge: 2015-09-05 | Disposition: A | Discharge status: Home / Self Care | Payer: No Typology Code available for payment source | Attending: Emergency Medicine | Admitting: Emergency Medicine

## 2015-09-05 ENCOUNTER — Emergency Department: Payer: No Typology Code available for payment source

## 2015-09-05 DIAGNOSIS — H938X9 Other specified disorders of ear, unspecified ear: Secondary | ICD-10-CM | POA: Insufficient documentation

## 2015-09-05 DIAGNOSIS — Z7901 Long term (current) use of anticoagulants: Secondary | ICD-10-CM | POA: Diagnosis not present

## 2015-09-05 DIAGNOSIS — Z79899 Other long term (current) drug therapy: Secondary | ICD-10-CM | POA: Diagnosis not present

## 2015-09-05 DIAGNOSIS — R42 Dizziness and giddiness: Secondary | ICD-10-CM | POA: Insufficient documentation

## 2015-09-05 DIAGNOSIS — Z3202 Encounter for pregnancy test, result negative: Secondary | ICD-10-CM | POA: Diagnosis not present

## 2015-09-05 DIAGNOSIS — H81399 Other peripheral vertigo, unspecified ear: Secondary | ICD-10-CM

## 2015-09-05 HISTORY — DX: Coagulation defect, unspecified: D68.9

## 2015-09-05 LAB — BASIC METABOLIC PANEL
Anion gap: 4 — ABNORMAL LOW (ref 5–15)
BUN: 12 mg/dL (ref 6–20)
CO2: 26 mmol/L (ref 22–32)
CREATININE: 0.74 mg/dL (ref 0.44–1.00)
Calcium: 9.3 mg/dL (ref 8.9–10.3)
Chloride: 109 mmol/L (ref 101–111)
GFR calc non Af Amer: >60 mL/min (ref >60)
Glucose, Bld: 100 mg/dL — ABNORMAL HIGH (ref 65–99)
Potassium: 4.2 mmol/L (ref 3.5–5.1)
SODIUM: 139 mmol/L (ref 135–145)

## 2015-09-05 LAB — CBC
HCT: 36.9 % (ref 35.0–47.0)
HEMOGLOBIN: 11.6 g/dL — AB (ref 12.0–16.0)
MCH: 22.3 pg — ABNORMAL LOW (ref 26.0–34.0)
MCHC: 31.4 g/dL — AB (ref 32.0–36.0)
MCV: 71.1 fL — ABNORMAL LOW (ref 80.0–100.0)
Platelets: 191 10*3/uL (ref 150–440)
RBC: 5.19 MIL/uL (ref 3.80–5.20)
RDW: 18.3 % — AB (ref 11.5–14.5)
WBC: 3.5 10*3/uL — ABNORMAL LOW (ref 3.6–11.0)

## 2015-09-05 LAB — PROTIME-INR
INR: 1.82
PROTHROMBIN TIME: 21.2 s — AB (ref 11.4–15.0)

## 2015-09-05 LAB — HCG, QUANTITATIVE, PREGNANCY: HCG, BETA CHAIN, QUANT, S: 1 m[IU]/mL (ref <5)

## 2015-09-05 MED ORDER — MECLIZINE HCL 25 MG PO TABS
25.0000 mg | ORAL_TABLET | Freq: Once | ORAL | Status: AC
  Administered 2015-09-05: 25 mg via ORAL
  Filled 2015-09-05: qty 1

## 2015-09-05 MED ORDER — IOHEXOL 350 MG/ML SOLN
75.0000 mL | Freq: Once | INTRAVENOUS | Status: AC | PRN
  Administered 2015-09-05: 75 mL via INTRAVENOUS

## 2015-09-05 MED ORDER — MECLIZINE HCL 12.5 MG PO TABS: 25.0000 mg | ORAL_TABLET | Freq: Three times a day (TID) | ORAL | Status: DC | PRN

## 2015-09-05 NOTE — ED Notes (Signed)
Pt presents with generalized weakness and dizziness started around 7am today. Pt with hx of clotting disorder recently dx'd, started on Xarelto app fifteen days ago. Denies any other sx at this time.

## 2015-09-05 NOTE — Discharge Instructions (Signed)
We believe your symptoms were caused by benign vertigo.  Please read through the included information and take any prescribed medication(s).  Follow up with your doctor as listed above. ° °If you develop any new or worsening symptoms that concern you, including but not limited to persistent dizziness/vertigo, numbness or weakness in your arms or legs, altered mental status, persistent vomiting, or fever greater than 101, please return immediately to the Emergency Department. ° ° °Vertigo °Vertigo means you feel like you or your surroundings are moving when they are not. Vertigo can be dangerous if it occurs when you are at work, driving, or performing difficult activities.  °CAUSES  °Vertigo occurs when there is a conflict of signals sent to your brain from the visual and sensory systems in your body. There are many different causes of vertigo, including: °· Infections, especially in the inner ear. °· A bad reaction to a drug or misuse of alcohol and medicines. °· Withdrawal from drugs or alcohol. °· Rapidly changing positions, such as lying down or rolling over in bed. °· A migraine headache. °· Decreased blood flow to the brain. °· Increased pressure in the brain from a head injury, infection, tumor, or bleeding. °SYMPTOMS  °You may feel as though the world is spinning around or you are falling to the ground. Because your balance is upset, vertigo can cause nausea and vomiting. You may have involuntary eye movements (nystagmus). °DIAGNOSIS  °Vertigo is usually diagnosed by physical exam. If the cause of your vertigo is unknown, your caregiver may perform imaging tests, such as an MRI scan (magnetic resonance imaging). °TREATMENT  °Most cases of vertigo resolve on their own, without treatment. Depending on the cause, your caregiver may prescribe certain medicines. If your vertigo is related to body position issues, your caregiver may recommend movements or procedures to correct the problem. In rare cases, if your  vertigo is caused by certain inner ear problems, you may need surgery. °HOME CARE INSTRUCTIONS  °· Follow your caregiver's instructions. °· Avoid driving. °· Avoid operating heavy machinery. °· Avoid performing any tasks that would be dangerous to you or others during a vertigo episode. °· Tell your caregiver if you notice that certain medicines seem to be causing your vertigo. Some of the medicines used to treat vertigo episodes can actually make them worse in some people. °SEEK IMMEDIATE MEDICAL CARE IF:  °· Your medicines do not relieve your vertigo or are making it worse. °· You develop problems with talking, walking, weakness, or using your arms, hands, or legs. °· You develop severe headaches. °· Your nausea or vomiting continues or gets worse. °· You develop visual changes. °· A family member notices behavioral changes. °· Your condition gets worse. °MAKE SURE YOU: °· Understand these instructions. °· Will watch your condition. °· Will get help right away if you are not doing well or get worse. °  °This information is not intended to replace advice given to you by your health care provider. Make sure you discuss any questions you have with your health care provider. °  °Document Released: 08/11/2005 Document Revised: 01/24/2012 Document Reviewed: 02/24/2015 °[employer] Interactive Patient Education ©2016 [employer] ° °

## 2015-09-05 NOTE — ED Provider Notes (Signed)
[facility] Emergency Department Provider Note REMINDER - THIS NOTE IS NOT A FINAL MEDICAL RECORD UNTIL IT IS SIGNED. UNTIL THEN, THE CONTENT BELOW MAY REFLECT INFORMATION FROM A DOCUMENTATION TEMPLATE, NOT THE ACTUAL PATIENT VISIT. ____________________________________________  Time seen: Approximately 11:48 AM  I have reviewed the triage vital signs and the nursing notes.   HISTORY  Chief Complaint Dizziness    HPI Taylor Hammond is a 34 y.o. female with a recent diagnosis of antiphospholipid syndrome and hypercoagulable state on Xarelto for about 2 weeks.  Patient presents today that notes that she went to bed normally approximately midnight, this morning when she woke up she felt dizzy especially when moving her head and going to arise from bed. She reports this feels somewhat like "vertigo" which she had once previously with similar presentation. She called her doctor today who advised coming to the ER to make sure she had not developed a "clot" that would've caused the symptoms. She does report a history of dermatologic involvement and antiphospholipid-type syndrome.  She denies any numbness or tingling, no weakness in the arms or legs, no trouble speaking. She reports that she feels dizzy especially when she goes to move it feels like the room is spinning. Yesterday she did notice a slight feeling of fullness in her left ear, but is not having any pain.  No headache, no fevers or chills. No trauma. Denies pregnancy.   Past Medical History  Diagnosis Date  . Crohn's disease (HCC)   . Clotting disorder [facility])     Patient Active Problem List   Diagnosis Date Noted  . Iron deficiency anemia 07/11/2015  . CD (Crohn's disease) (HCC) 07/03/2012    Past Surgical History  Procedure Laterality Date  . Cesarean section      X 2    Current Outpatient Rx  Name  Route  Sig  Dispense  Refill  . hydroxychloroquine (PLAQUENIL) 200 MG tablet   Oral   Take 200  mg by mouth daily.         . Rivaroxaban (XARELTO STARTER PACK) 15 & 20 MG TBPK      Take as directed on package: Start with one  tablet by mouth twice a day with food. On Day 22, switch to one  tablet once a day with food.   51 each   0   . rivaroxaban (XARELTO) 20 MG TABS tablet   Oral   Take 1 tablet (20 mg total) by mouth daily with supper. Initiate after completion of starter pack.   30 tablet   5   . meclizine (ANTIVERT) 12.5 MG tablet   Oral   Take 2 tablets (25 mg total) by mouth 3 (three) times daily as needed for dizziness or nausea.   30 tablet   0     Allergies Codeine and Dairy aid  No family history on file.  Social History Social History  Substance Use Topics  . Smoking status: Never Smoker   . Smokeless tobacco: None  . Alcohol Use: 0.0 oz/week    0 Standard drinks or equivalent per week    Review of Systems Constitutional: No fever/chills Eyes: No visual changes. ENT: No sore throat. Cardiovascular: Denies chest pain. Respiratory: Denies shortness of breath. Gastrointestinal: No abdominal pain.  No nausea, no vomiting.  No diarrhea.  No constipation. Genitourinary: Negative for dysuria. Musculoskeletal: Negative for back pain. Skin: Negative for rash. Neurological: Negative for headaches, focal weakness or numbness.  10-point ROS otherwise negative.  ____________________________________________   PHYSICAL EXAM:  VITAL SIGNS: ED Triage Vitals  Enc Vitals Group     BP 09/05/15 0859 116/71 mmHg     Pulse Rate 09/05/15 0859 67     Resp 09/05/15 0859 20     Temp 09/05/15 0902 97.3 F (36.3 C)     Temp Source 09/05/15 0902 Oral     SpO2 09/05/15 0859 100 %     Weight 09/05/15 0859 190 lb (86.183 kg)     Height 09/05/15 0859 5\' 9"  (1.753 m)     Head Cir --      Peak Flow --      Pain Score --      Pain Loc --      Pain Edu? --      Excl. in GC? --    Constitutional: Alert and oriented. Well appearing and in no acute  distress. Eyes: Conjunctivae are normal. PERRL. EOMI. Head: Atraumatic. No nystagmus, the patient requested we not have her turn her head side-to-side for exam as it would cause her to become very nauseated. Nose: No congestion/rhinnorhea. Mouth/Throat: Mucous membranes are moist.  Oropharynx non-erythematous. Neck: No stridor.  No meningismus. No bruits. Cardiovascular: Normal rate, regular rhythm. Grossly normal heart sounds.  Good peripheral circulation. Respiratory: Normal respiratory effort.  No retractions. Lungs CTAB. Gastrointestinal: Soft and nontender. No distention. No abdominal bruits. No CVA tenderness. Musculoskeletal: No lower extremity tenderness nor edema.  No joint effusions. Neurologic:  Normal speech and language. No gross focal neurologic deficits are appreciated.  NIH score equals 0, performed by me at bedside. The patient has no pronator drift. The patient has normal cranial nerve exam. Extraocular movements are normal. Visual fields are normal. Patient has 5 out of 5 strength in all extremities. There is no numbness or gross, acute sensory abnormality in the extremities bilaterally. No speech disturbance. No dysarthria. No aphasia. No ataxia. Normal finger nose finger bilat. Patient speaking in full and clear sentences.   Skin:  Skin is warm, dry and intact. No rash noted. Psychiatric: Mood and affect are normal. Speech and behavior are normal.  ____________________________________________   LABS (all labs ordered are listed, but only abnormal results are displayed)  Labs Reviewed  CBC - Abnormal; Notable for the following:    WBC 3.5 (*)    Hemoglobin 11.6 (*)    MCV 71.1 (*)    MCH 22.3 (*)    MCHC 31.4 (*)    RDW 18.3 (*)    All other components within normal limits  BASIC METABOLIC PANEL - Abnormal; Notable for the following:    Glucose, Bld 100 (*)    Anion gap 4 (*)    All other components within normal limits  PROTIME-INR - Abnormal;  Notable for the following:    Prothrombin Time 21.2 (*)    All other components within normal limits  HCG, QUANTITATIVE, PREGNANCY   ____________________________________________  EKG  ED ECG REPORT I, QUALE, MARK, the attending physician, personally viewed and interpreted this ECG.  Date: 09/05/2015 EKG Time: 9:40 AM Rate: 60 Rhythm: normal sinus rhythm QRS Axis: normal Intervals: normal ST/T Wave abnormalities: normal Conduction Disutrbances: none Narrative Interpretation: unremarkable  ____________________________________________  RADIOLOGY  CLINICAL DATA: Vertigo. Photosensitivity. Weakness. Possible hypercoagulable state.  EXAM: CT ANGIOGRAPHY HEAD  TECHNIQUE: Multidetector CT imaging of the head was performed using the standard protocol during bolus administration of intravenous contrast. Multiplanar CT image reconstructions and MIPs were obtained to evaluate the vascular anatomy.  CONTRAST: 24mL  OMNIPAQUE IOHEXOL 350 MG/ML SOLN  COMPARISON: None.  FINDINGS: CT HEAD  Calvarium and skull base: No fracture or destructive lesion. Mastoids and middle ears are clear.  Paranasal sinuses: Imaged portions are clear.  Orbits: Negative.  Brain: No evidence of acute abnormality, including acute infarct, hemorrhage, hydrocephalus, or mass lesion. No areas of chronic infarction.  CTA HEAD  Anterior circulation: No significant stenosis, proximal occlusion, aneurysm, or vascular malformation.  Posterior circulation: No significant stenosis, proximal occlusion, aneurysm, or vascular malformation.  Venous sinuses: As permitted by contrast timing, patent.  Anatomic variants: None of significance.  Delayed phase: No abnormal intracranial enhancement.  IMPRESSION: Negative exam.  ____________________________________________   PROCEDURES  Procedure(s) performed: None  Critical Care performed:  No  ____________________________________________   INITIAL IMPRESSION / ASSESSMENT AND PLAN / ED COURSE  Pertinent labs & imaging results that were available during my care of the patient were reviewed by me and considered in my medical decision making (see chart for details).  Patient presents with vertigo symptoms and normal neurologic exam. She is, however known to have hypercoagulable state but is on Xarelto making this seem unlikely to be related to embolic phenomena. I discussed with Dr. Loretha Brasil of neurology, given the patient's history of hypercoagulable state he advised obtaining a CT angiography of the head. If CT angiography is negative, continue Xarelto and add Antivert for treatment of vertigo.  The patient is in no distress, again completely neurologically intact. Her symptoms do appear to be positional in nature and I suspect primarily peripheral vertigo and discusses with the patient, but given her antiphospholipid syndrome we will proceed with CT angiography to rule out vascular anomaly or embolism.  No cardiac symptoms, normal EKG. Blood work reassuring. HCG negative.  ----------------------------------------- 12:19 PM on 09/05/2015 -----------------------------------------  Patient reports her symptoms are much better after meclizine. Seen and advised discharge by Dr. Nona Dell, I am agreeable and will prescribe the patient prescription for Antivert. Follow up with her primary care physician and rheumatologist as previously planned. Advised against driving until patient is sure that her symptoms have resolved as another episode of vertigo could poor risk for crash.  Return precautions advised ____________________________________________   FINAL CLINICAL IMPRESSION(S) / ED DIAGNOSES  Final diagnoses:  Vertigo, peripheral, unspecified laterality      Sharyn Creamer, MD 09/05/15 1221

## 2015-09-12 ENCOUNTER — Other Ambulatory Visit: Payer: Self-pay | Admitting: Oncology

## 2015-09-12 ENCOUNTER — Telehealth: Payer: Self-pay | Admitting: *Deleted

## 2015-09-12 NOTE — Telephone Encounter (Signed)
Explained that she has refill son rx, she will call pharmacy

## 2015-10-27 ENCOUNTER — Inpatient Hospital Stay: Payer: No Typology Code available for payment source | Admitting: Oncology

## 2015-10-27 ENCOUNTER — Inpatient Hospital Stay: Payer: No Typology Code available for payment source

## 2015-11-06 ENCOUNTER — Inpatient Hospital Stay: Payer: No Typology Code available for payment source | Attending: Oncology

## 2015-11-06 ENCOUNTER — Inpatient Hospital Stay (HOSPITAL_BASED_OUTPATIENT_CLINIC_OR_DEPARTMENT_OTHER): Payer: No Typology Code available for payment source | Admitting: Oncology

## 2015-11-06 VITALS — BP 127/80 | HR 83 | Temp 96.5°F | Resp 20 | Wt 212.7 lb

## 2015-11-06 DIAGNOSIS — Z7901 Long term (current) use of anticoagulants: Secondary | ICD-10-CM

## 2015-11-06 DIAGNOSIS — K5090 Crohn's disease, unspecified, without complications: Secondary | ICD-10-CM | POA: Diagnosis not present

## 2015-11-06 DIAGNOSIS — Z79899 Other long term (current) drug therapy: Secondary | ICD-10-CM | POA: Diagnosis not present

## 2015-11-06 DIAGNOSIS — D6862 Lupus anticoagulant syndrome: Secondary | ICD-10-CM | POA: Insufficient documentation

## 2015-11-06 DIAGNOSIS — D649 Anemia, unspecified: Secondary | ICD-10-CM | POA: Diagnosis not present

## 2015-11-06 DIAGNOSIS — L985 Mucinosis of the skin: Secondary | ICD-10-CM | POA: Insufficient documentation

## 2015-11-06 DIAGNOSIS — D6861 Antiphospholipid syndrome: Secondary | ICD-10-CM | POA: Diagnosis not present

## 2015-11-06 LAB — CBC WITH DIFFERENTIAL/PLATELET
BASOS ABS: 0 10*3/uL (ref 0–0.1)
Basophils Relative: 1 %
EOS PCT: 1 %
Eosinophils Absolute: 0.1 10*3/uL (ref 0–0.7)
HEMATOCRIT: 34.8 % — AB (ref 35.0–47.0)
Hemoglobin: 11.1 g/dL — ABNORMAL LOW (ref 12.0–16.0)
LYMPHS PCT: 23 %
Lymphs Abs: 1 10*3/uL (ref 1.0–3.6)
MCH: 22.8 pg — AB (ref 26.0–34.0)
MCHC: 31.7 g/dL — AB (ref 32.0–36.0)
MCV: 72 fL — AB (ref 80.0–100.0)
MONO ABS: 0.4 10*3/uL (ref 0.2–0.9)
MONOS PCT: 9 %
Neutro Abs: 2.9 10*3/uL (ref 1.4–6.5)
Neutrophils Relative %: 66 %
PLATELETS: 192 10*3/uL (ref 150–440)
RBC: 4.84 MIL/uL (ref 3.80–5.20)
RDW: 16.6 % — AB (ref 11.5–14.5)
WBC: 4.4 10*3/uL (ref 3.6–11.0)

## 2015-11-06 LAB — IRON AND TIBC
IRON: 163 ug/dL (ref 28–170)
Saturation Ratios: 34 % — ABNORMAL HIGH (ref 10.4–31.8)
TIBC: 477 ug/dL — AB (ref 250–450)
UIBC: 314 ug/dL

## 2015-11-06 LAB — FERRITIN: FERRITIN: 7 ng/mL — AB (ref 11–307)

## 2015-11-06 NOTE — Progress Notes (Signed)
Patient here today for routine follow up regarding antiphospholipid antibody syndrome, needs to discuss different option for anticoagulant.

## 2015-11-14 LAB — PROTHROMBIN GENE MUTATION

## 2015-11-14 LAB — PTT-LA MIX: PTT-LA Mix: 43.8 s — ABNORMAL HIGH (ref 0.0–40.6)

## 2015-11-14 LAB — LUPUS ANTICOAGULANT PANEL
DRVVT: 146.4 s — ABNORMAL HIGH (ref 0.0–44.0)
PTT Lupus Anticoagulant: 49.7 s — ABNORMAL HIGH (ref 0.0–40.6)

## 2015-11-14 LAB — FACTOR 5 LEIDEN

## 2015-11-14 LAB — HEXAGONAL PHASE PHOSPHOLIPID: Hexagonal Phase Phospholipid: 40 s — ABNORMAL HIGH (ref 0–11)

## 2015-11-14 LAB — DRVVT CONFIRM: dRVVT Confirm: 2.7 ratio — ABNORMAL HIGH (ref 0.8–1.2)

## 2015-11-14 LAB — DRVVT MIX: DRVVT MIX: 64.5 s — AB (ref 0.0–44.0)

## 2015-11-16 NOTE — Progress Notes (Signed)
[facility]  [phone] [phone]  ID: Margaree Mackintosh OB: 09/17/1981  MR#: [id number]  BZJ#:[id number]  Patient Care Team: No Pcp Per Patient as PCP - General (General Practice)  CHIEF COMPLAINT:  Chief Complaint  Patient presents with  . antiphospholipid antibody syndrome    follow up    INTERVAL HISTORY: Patient returns to clinic today for repeat laboratory work and further evaluation. She is tolerating Xarelto well without significant side effects. She currently feels well and is asymptomatic. She has no neurologic complaints. She denies any recent fevers or illnesses. She denies any further chest pain or shortness of breath.  She has a good appetite and denies weight loss. She has no nausea, vomiting, constipation, or diarrhea. She has no urinary complaints. Patient offers no specific complaints today.  REVIEW OF SYSTEMS:   Review of Systems  Constitutional: Negative for fever, weight loss and malaise/fatigue.  Respiratory: Negative.   Cardiovascular: Negative for chest pain.  Gastrointestinal: Negative.  Negative for blood in stool and melena.  Genitourinary: Negative.   Musculoskeletal: Negative.   Skin: Positive for rash.  Neurological: Negative.  Negative for weakness.    As per HPI. Otherwise, a complete review of systems is negatve.  PAST MEDICAL HISTORY: Past Medical History  Diagnosis Date  . Crohn's disease (HCC)   . Clotting disorder (HCC)     PAST SURGICAL HISTORY: Past Surgical History  Procedure Laterality Date  . Cesarean section      X 2    FAMILY HISTORY: Brother with pulmonary embolism at age 61 of unknown etiology.      ADVANCED DIRECTIVES:    HEALTH MAINTENANCE: Social History  Substance Use Topics  . Smoking status: Never Smoker   . Smokeless tobacco: Not on file  . Alcohol Use: 0.0 oz/week    0 Standard drinks or equivalent per week     Colonoscopy:  PAP:  Bone density:  Lipid  panel:  Allergies  Allergen Reactions  . Codeine   . Dairy Aid [Lactase]     Current Outpatient Prescriptions  Medication Sig Dispense Refill  . hydroxychloroquine (PLAQUENIL) 200 MG tablet Take 200 mg by mouth daily.    . rivaroxaban (XARELTO) 20 MG TABS tablet Take 1 tablet (20 mg total) by mouth daily with supper. Initiate after completion of starter pack. 30 tablet 5   No current facility-administered medications for this visit.    OBJECTIVE: Filed Vitals:   11/06/15 1524  BP: 127/80  Pulse: 83  Temp: 96.5 F (35.8 C)  Resp: 20     Body mass index is 31.4 kg/(m^2).    ECOG FS:0 - Asymptomatic  General: Well-developed, well-nourished, no acute distress. Eyes: Pink conjunctiva, anicteric sclera. Lungs: Clear to auscultation bilaterally. Heart: Regular rate and rhythm. No rubs, murmurs, or gallops. Abdomen: Soft, nontender, nondistended. No organomegaly noted, normoactive bowel sounds. Musculoskeletal: No edema, cyanosis, or clubbing. Neuro: Alert, answering all questions appropriately. Cranial nerves grossly intact. Skin: Rash consistent with livedo reticularis. Psych: Normal affect.  LAB RESULTS:  Lab Results  Component Value Date   NA 139 09/05/2015   K 4.2 09/05/2015   CL 109 09/05/2015   CO2 26 09/05/2015   GLUCOSE 100* 09/05/2015   BUN 12 09/05/2015   CREATININE 0.74 09/05/2015   CALCIUM 9.3 09/05/2015   PROT 8.3* 07/09/2015   ALBUMIN 4.3 07/09/2015   AST 23 07/09/2015   ALT 20 07/09/2015   ALKPHOS 68 07/09/2015   BILITOT 0.3 07/09/2015   GFRNONAA >60  09/05/2015   GFRAA >60 09/05/2015    Lab Results  Component Value Date   WBC 4.4 11/06/2015   NEUTROABS 2.9 11/06/2015   HGB 11.1* 11/06/2015   HCT 34.8* 11/06/2015   MCV 72.0* 11/06/2015   PLT 192 11/06/2015   Anticardiolipin antibody IgG 117, anticardiolipin antibody IgM 14, anticardiolipin antibody IgA 37. Beta-2 glycoprotein antibody IgG 102, beta-2 glycoprotein antibody IgA 65, beta-2  glycoprotein antibody IgM.  STUDIES: No results found.  ASSESSMENT: Anti-phospholipid syndrome  PLAN:    1. Anti-phospholipid syndrome: Patient has positive anticardiolipin antibodies as well as significantly elevated beta-2 glycoprotein antibodies. 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  She does not have evidence of SLE. Although pregnancy complications are frequent with APS, patient has had 2 children without complication. She is on hydroxychloroquine, but benefit in APS is unclear.  Continue Xarelto (Eliquis was denied by insurance). The remainder of patient's hypercoagulable workup was positive for lupus anticoagulant, but this may be a false positive given the fact that she is taking Xarelto. We will also have to repeat her anticardiolipin antibodies in the near future.  2. Anemia: Patient's hemoglobin and iron stores are mildly decreased and she may benefit from IV Feraheme in the future.   Approximately 30 minutes was spent in discussion of which greater than 50% was consultation.   Patient expressed understanding and was in agreement with this plan. She also understands that She can call clinic at any time with any questions, concerns, or complaints.    Jeralyn Ruths, MD   11/16/2015 11:30 AM

## 2015-11-19 ENCOUNTER — Ambulatory Visit: Payer: No Typology Code available for payment source | Admitting: Oncology

## 2015-11-19 ENCOUNTER — Other Ambulatory Visit: Payer: Self-pay | Admitting: *Deleted

## 2015-11-19 ENCOUNTER — Other Ambulatory Visit: Payer: No Typology Code available for payment source

## 2015-12-09 ENCOUNTER — Inpatient Hospital Stay: Payer: Self-pay | Admitting: Oncology

## 2015-12-09 ENCOUNTER — Inpatient Hospital Stay: Payer: No Typology Code available for payment source

## 2015-12-09 ENCOUNTER — Inpatient Hospital Stay: Payer: Self-pay

## 2015-12-17 ENCOUNTER — Other Ambulatory Visit: Payer: Self-pay | Admitting: *Deleted

## 2015-12-17 DIAGNOSIS — D509 Iron deficiency anemia, unspecified: Secondary | ICD-10-CM

## 2015-12-18 ENCOUNTER — Inpatient Hospital Stay: Payer: Self-pay | Admitting: Oncology

## 2015-12-18 ENCOUNTER — Inpatient Hospital Stay: Payer: Self-pay

## 2015-12-29 ENCOUNTER — Telehealth: Payer: Self-pay | Admitting: *Deleted

## 2015-12-29 MED ORDER — WARFARIN SODIUM 5 MG PO TABS
5.0000 mg | ORAL_TABLET | Freq: Every day | ORAL | Status: DC
Start: 2015-12-29 — End: 2017-06-27

## 2015-12-29 NOTE — Telephone Encounter (Signed)
Thank you.  Did she every go to [facility]?

## 2015-12-29 NOTE — Telephone Encounter (Signed)
Per Dr Orlie Dakin, warfarin 5 mg daily and come in in 1 week for PT/INR check. Patient must find a PCP to manage her warfarin and PT/INR's once she is on a stable dose. I have placed a call to pt but had to leave a VM for her to return my call

## 2015-12-29 NOTE — Telephone Encounter (Signed)
She did not say, but from our conversation, I don't think so. She is talking about going to [facility]

## 2015-12-29 NOTE — Telephone Encounter (Signed)
Pt has agreed to come in for lab check next Tuesday but then wants to go to [facility] for lab draws, I advised her that she needs to see if they will be her PCP to take over her warfarin ordering and monitoring once she is stable per Dr Orlie Dakin

## 2015-12-29 NOTE — Telephone Encounter (Signed)
Called to report that she will be out of her Xarelto tonight and that Dr Orlie Dakin was to see about starting her on Warfarin since it is cheaper. Will need new rx before dose tomorrow. If you change her when do you want to check labs on her?  She has cancelled her last 2 appts due to no insurance and she has no fu or lab appts scheduled either

## 2016-01-05 ENCOUNTER — Other Ambulatory Visit: Payer: Self-pay | Admitting: *Deleted

## 2016-01-05 DIAGNOSIS — D509 Iron deficiency anemia, unspecified: Secondary | ICD-10-CM

## 2016-01-06 ENCOUNTER — Inpatient Hospital Stay: Payer: Self-pay | Attending: Oncology

## 2016-01-06 ENCOUNTER — Inpatient Hospital Stay: Payer: Self-pay

## 2016-01-06 DIAGNOSIS — D649 Anemia, unspecified: Secondary | ICD-10-CM | POA: Insufficient documentation

## 2016-01-06 DIAGNOSIS — Z7901 Long term (current) use of anticoagulants: Secondary | ICD-10-CM | POA: Insufficient documentation

## 2016-01-06 DIAGNOSIS — D6861 Antiphospholipid syndrome: Secondary | ICD-10-CM | POA: Insufficient documentation

## 2016-01-06 DIAGNOSIS — D509 Iron deficiency anemia, unspecified: Secondary | ICD-10-CM

## 2016-01-06 LAB — PROTIME-INR
INR: 1.97
PROTHROMBIN TIME: 22.3 s — AB (ref 11.4–15.0)

## 2016-01-07 ENCOUNTER — Telehealth: Payer: Self-pay | Admitting: *Deleted

## 2016-01-07 NOTE — Telephone Encounter (Signed)
Call placed to patient regarding PT/INR results, no answer but message left on vm regarding results and no change in dosage.

## 2016-05-24 IMAGING — CT CT ANGIO HEAD
3 of 9 series · 16 of 47 positions shown · IV contrast (APPLIED)
Comparison: None.

CLINICAL DATA: Vertigo. Photosensitivity. Weakness. Possible
hypercoagulable state.

EXAM:
CT ANGIOGRAPHY HEAD
TECHNIQUE: Multidetector CT imaging of the head was performed using the
standard protocol during bolus administration of intravenous
contrast. Multiplanar CT image reconstructions and MIPs were
obtained to evaluate the vascular anatomy.
CONTRAST:  75mL OMNIPAQUE IOHEXOL 350 MG/ML SOLN

[Series 6: cta head · axial · 0.39mm/px · z∈[-258,-141]mm · 10 of 143 slices shown]
[im 13/143  brain]
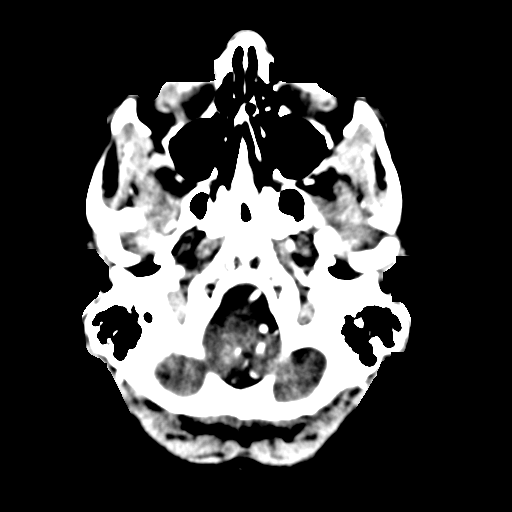
[im 26/143  bone]
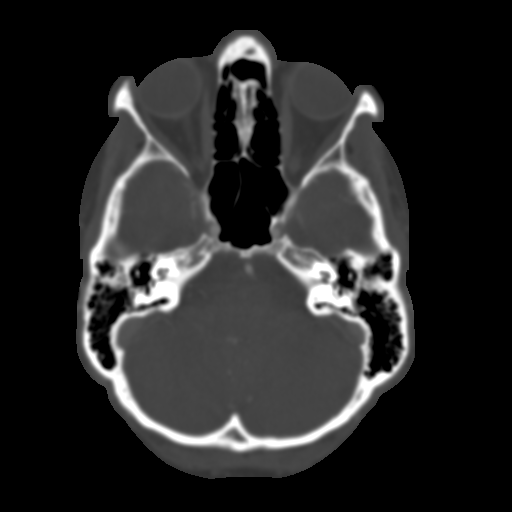
[im 39/143  brain]
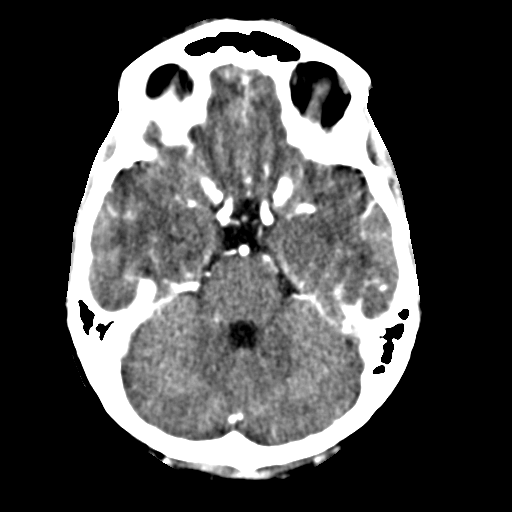
[im 52/143  bone]
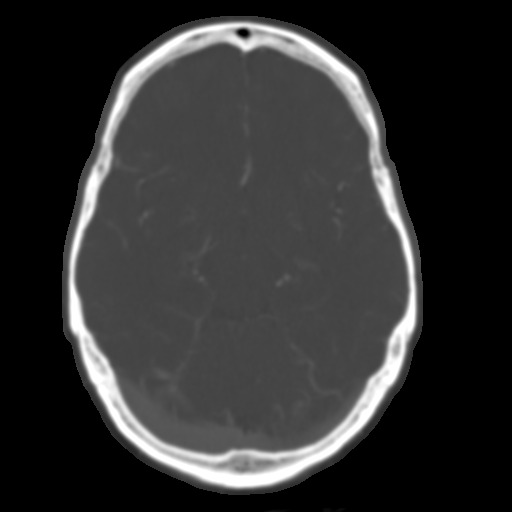
[im 65/143  brain]
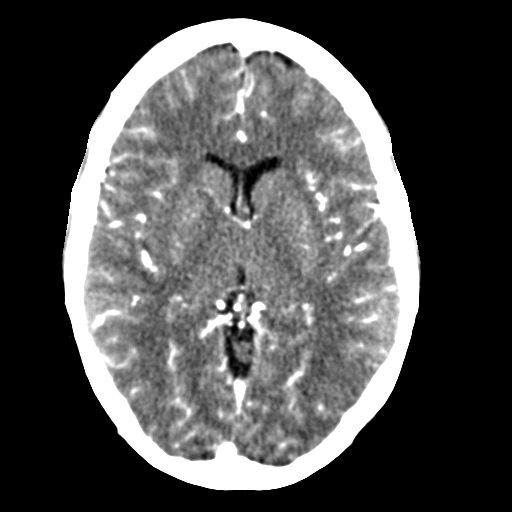
[im 78/143  bone]
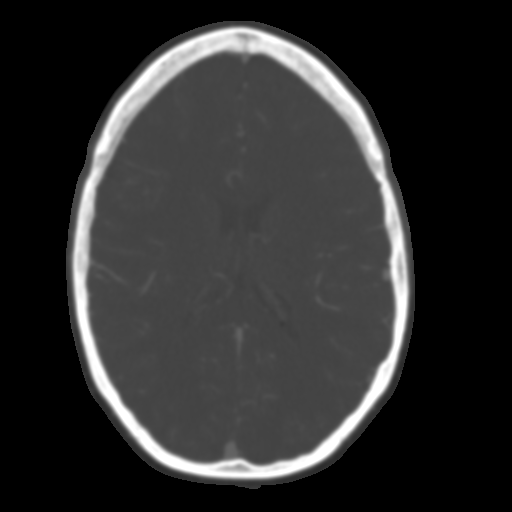
[im 91/143  brain]
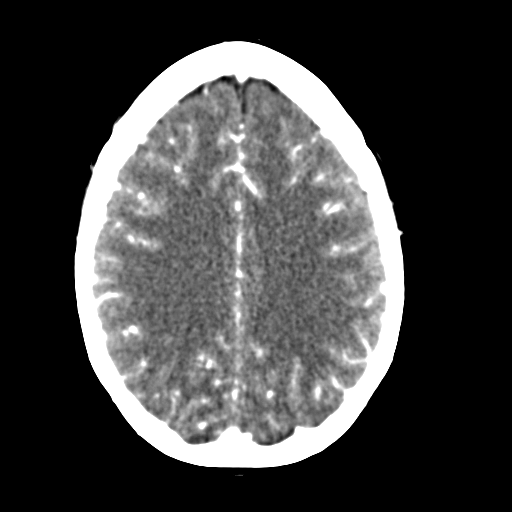
[im 104/143  bone]
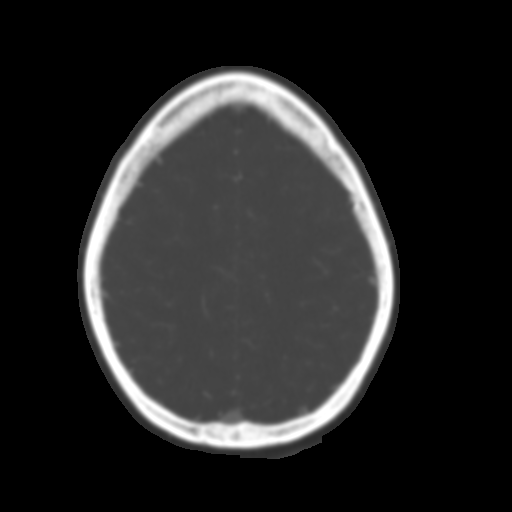
[im 117/143  brain]
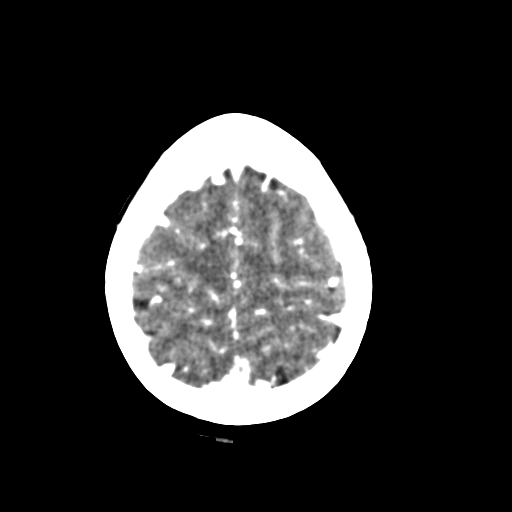
[im 130/143  bone]
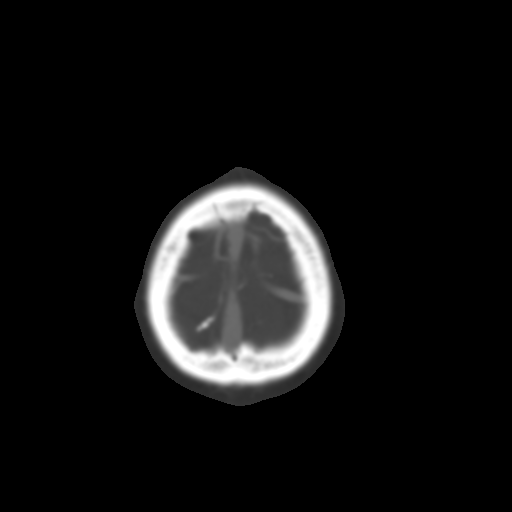

[Series 9: cor thin · coronal · 0.28mm/px · 3 of 191 slices shown]
[im 55/191  brain]
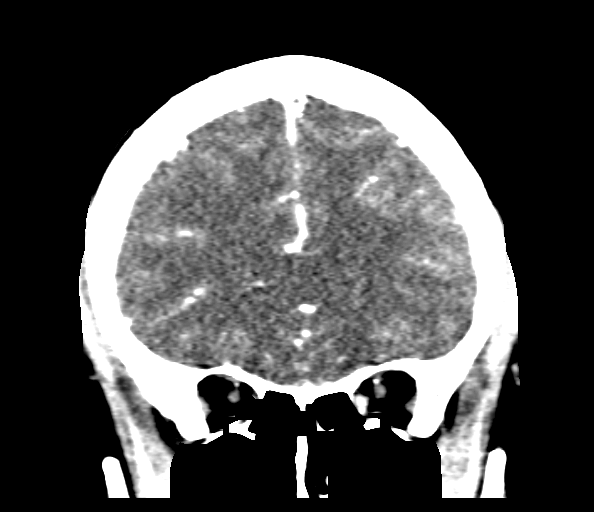
[im 82/191  brain]
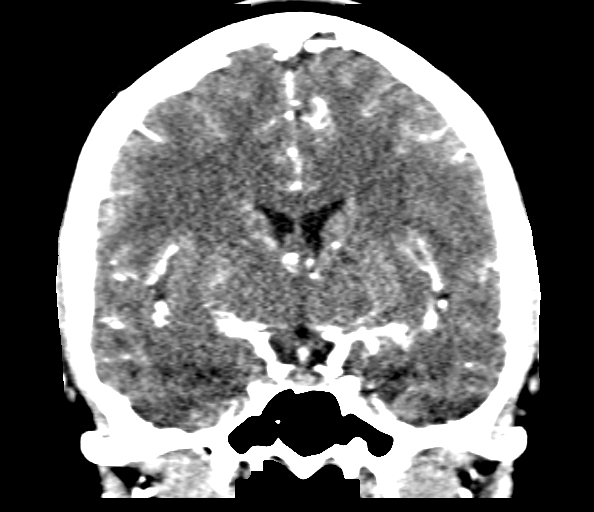
[im 109/191  brain]
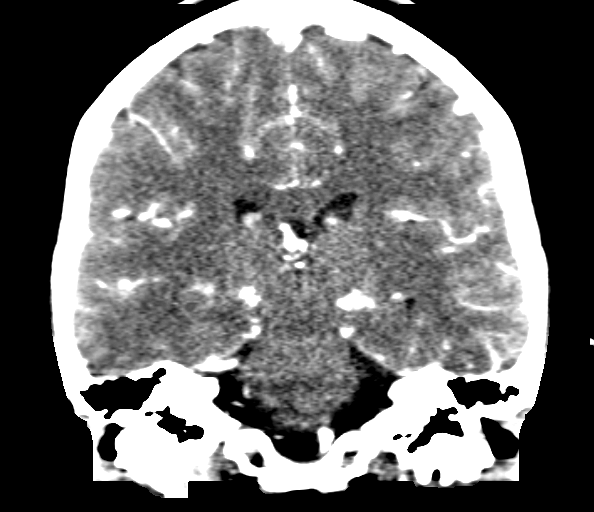

[Series 11: sag thin · sagittal · 0.28mm/px · 3 of 148 slices shown]
[im 30/148  brain]
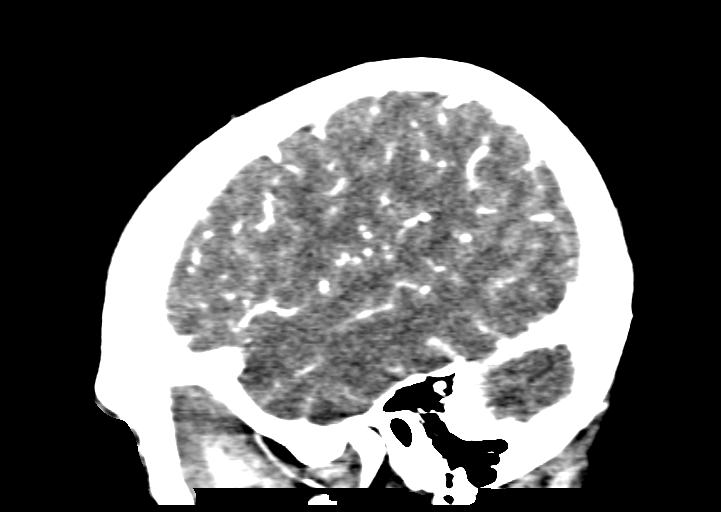
[im 59/148  brain]
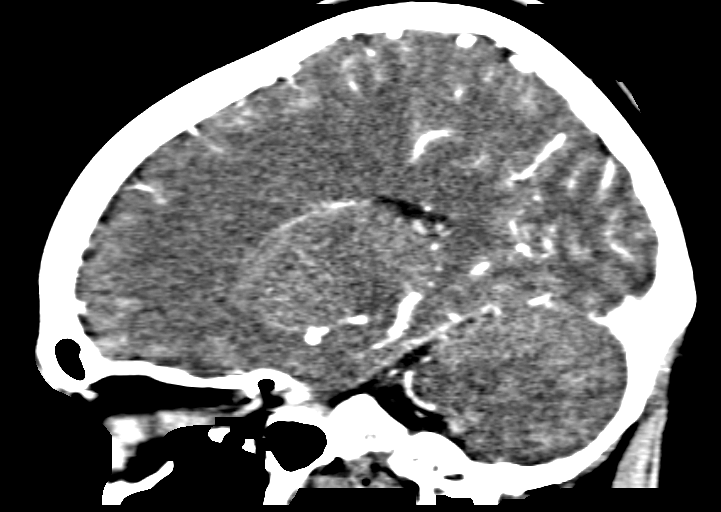
[im 89/148  brain]
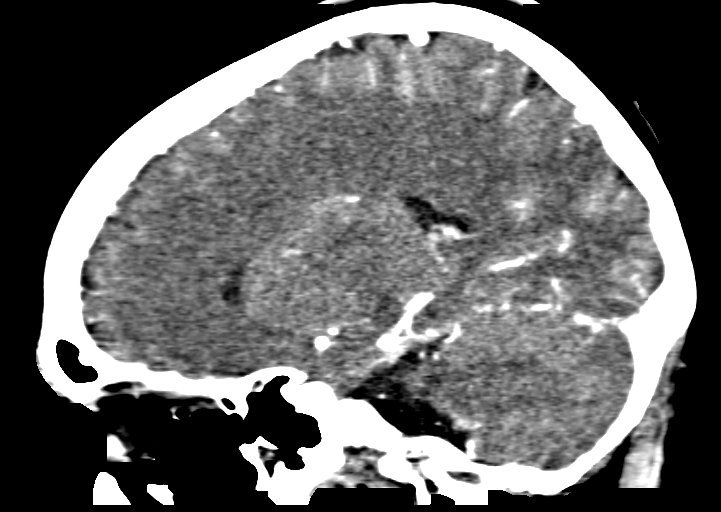

[16 of 47 positions shown; findings below may reference images not displayed]

FINDINGS: CT HEAD

Calvarium and skull base: No fracture or destructive lesion.
Mastoids and middle ears are clear.

Paranasal sinuses: Imaged portions are clear.

Orbits: Negative.

Brain: No evidence of acute abnormality, including acute infarct,
hemorrhage, hydrocephalus, or mass lesion. No areas of chronic
infarction.

CTA HEAD

Anterior circulation: No significant stenosis, proximal occlusion,
aneurysm, or vascular malformation.

Posterior circulation: No significant stenosis, proximal occlusion,
aneurysm, or vascular malformation.

Venous sinuses: As permitted by contrast timing, patent.

Anatomic variants: None of significance.

Delayed phase:   No abnormal intracranial enhancement.
IMPRESSION: Negative exam.

## 2017-06-27 ENCOUNTER — Ambulatory Visit
Admission: EM | Admit: 2017-06-27 | Discharge: 2017-06-27 | Disposition: A | Discharge status: Home / Self Care | Payer: Self-pay | Attending: Family Medicine | Admitting: Family Medicine

## 2017-06-27 ENCOUNTER — Encounter: Payer: Self-pay | Admitting: Emergency Medicine

## 2017-06-27 DIAGNOSIS — H9202 Otalgia, left ear: Secondary | ICD-10-CM

## 2017-06-27 DIAGNOSIS — H60502 Unspecified acute noninfective otitis externa, left ear: Secondary | ICD-10-CM

## 2017-06-27 MED ORDER — CIPROFLOXACIN-DEXAMETHASONE 0.3-0.1 % OT SUSP: 4.0000 [drp] | Freq: Two times a day (BID) | OTIC | 0 refills | Status: AC

## 2017-06-27 NOTE — Discharge Instructions (Signed)
Use medication as prescribed. Rest. Drink plenty of fluids. Keep dry.  Follow up with your primary care physician this week for follow up. Return to Urgent care for new or worsening concerns.

## 2017-06-27 NOTE — ED Provider Notes (Signed)
[facility] ____________________________________________  Time seen: Approximately 10:34 AM  I have reviewed the triage vital signs and the nursing notes.   HISTORY  Chief Complaint Otalgia   HPI Taylor Hammond is a 36 y.o. female  presents for evaluation of left ear pain has been present since yesterday. Patient describes pain as throbbing to left ear and some fullness sensation. Denies pain radiation, the drainage, bleeding or trauma. Reports has been swimming recently. Reports has had some runny nose and nasal congestion and cough the last couple days consistent with cold. Patient states other than left ear she feels well. States left ear pain is moderate. States has taken over-the-counter advil with minimal improvement. Denies other over-the-counter medications attempted. Denies hearing changes or tinnitus. Denies dental pain or grinding teeth. Reports otherwise feels well and denies other complaints. Denies other diarrhea alleviating factors. Denies recent sickness. Denies recent antibiotic use.   Center, [facility]: PCP Patient's last menstrual period was 06/25/2017 (approximate).denies pregnancy   Past Medical History:  Diagnosis Date  . Clotting disorder (HCC)   . Crohn's disease [facility])     Patient Active Problem List   Diagnosis Date Noted  . Iron deficiency anemia 07/11/2015  . CD (Crohn's disease) (HCC) 07/03/2012    Past Surgical History:  Procedure Laterality Date  . CESAREAN SECTION     X 2     No current facility-administered medications for this encounter.   Current Outpatient Prescriptions:  .  rivaroxaban (XARELTO) 20 MG TABS tablet, Take 20 mg by mouth daily with supper., Disp: , Rfl:  .  ciprofloxacin-dexamethasone (CIPRODEX) OTIC suspension, Place 4 drops into the left ear 2 (two) times daily., Disp: 7.5 mL, Rfl: 0 .  hydroxychloroquine (PLAQUENIL) 200 MG tablet, Take 200 mg by mouth daily., Disp: , Rfl:    Allergies Codeine and Dairy aid [lactase]  History reviewed. No pertinent family history.  Social History Social History  Substance Use Topics  . Smoking status: Never Smoker  . Smokeless tobacco: Never Used  . Alcohol use 0.0 oz/week    Review of Systems Constitutional: No fever/chills Eyes: No visual changes. ENT: No sore throat. As above.  Cardiovascular: Denies chest pain. Respiratory: Denies shortness of breath. Gastrointestinal: No abdominal pain.  No nausea, no vomiting.  Genitourinary: Negative for dysuria. Musculoskeletal: Negative for back pain. Skin: Negative for rash. .  ____________________________________________   PHYSICAL EXAM:  VITAL SIGNS: ED Triage Vitals  Enc Vitals Group     BP 06/27/17 1031 119/70     Pulse Rate 06/27/17 1031 78     Resp 06/27/17 1031 14     Temp 06/27/17 1031 98.4 F (36.9 C)     Temp Source 06/27/17 1031 Oral     SpO2 06/27/17 1031 99 %     Weight 06/27/17 1029 190 lb (86.2 kg)     Height 06/27/17 1029 5\' 9"  (1.753 m)     Head Circumference --      Peak Flow --      Pain Score 06/27/17 1029 2     Pain Loc --      Pain Edu? --      Excl. in GC? --    Constitutional: Alert and oriented. Well appearing and in no acute distress. Eyes: Conjunctivae are normal.  Head: Atraumatic. No sinus tenderness to palpation. No swelling. No erythema.  Ears: Right: Nontender, no erythema, normal TM, no drainage. Left: Mild tenderness with auricle movement, moderate canal swelling with some discharge, normal-appearing  TM but unable to visualize full TM. No surrounding tenderness, swelling or erythema bilaterally. No mastoid tenderness bilaterally.   Nose:Nasal congestion with clear rhinorrhea  Mouth/Throat: Mucous membranes are moist. No pharyngeal erythema. No tonsillar swelling or exudate.  Neck: No stridor.  No cervical spine tenderness to palpation. Hematological/Lymphatic/Immunilogical: No cervical lymphadenopathy. Cardiovascular:  Normal rate, regular rhythm. Grossly normal heart sounds.  Good peripheral circulation. Respiratory: Normal respiratory effort.  No retractions. No wheezes, rales or rhonchi. Good air movement.  Musculoskeletal: Ambulatory with steady gait. No cervical, thoracic or lumbar tenderness to palpation. Neurologic:  Normal speech and language. No gait instability. Skin:  Skin appears warm, dry. Psychiatric: Mood and affect are normal. Speech and behavior are normal.  ___________________________________________   LABS (all labs ordered are listed, but only abnormal results are displayed)  Labs Reviewed - No data to display   PROCEDURES Procedures    INITIAL IMPRESSION / ASSESSMENT AND PLAN / ED COURSE  Pertinent labs & imaging results that were available during my care of the patient were reviewed by me and considered in my medical decision making (see chart for details).  Well-appearing patient. No acute distress. Left otitis externa. Discussed with patient unable to visualize full TM, but TM appears normal. Will treat with Suprax otic. Discussed keeping dry and supportive care. Encouraged rest, fluids and follow-up as needed.Discussed indication, risks and benefits of medications with patient.  Discussed follow up with Primary care physician this week. Discussed follow up and return parameters including no resolution or any worsening concerns. Patient verbalized understanding and agreed to plan.   ____________________________________________   FINAL CLINICAL IMPRESSION(S) / ED DIAGNOSES  Final diagnoses:  Acute otitis externa of left ear, unspecified type  Acute otalgia, left     Discharge Medication List as of 06/27/2017 10:49 AM    START taking these medications   Details  ciprofloxacin-dexamethasone (CIPRODEX) OTIC suspension Place 4 drops into the left ear 2 (two) times daily., Starting Mon 06/27/2017, Until Mon 07/04/2017, Normal        Note: This dictation was prepared  with Dragon dictation along with smaller phrase technology. Any transcriptional errors that result from this process are unintentional.         Renford Dills, NP 06/27/17 1652

## 2017-06-27 NOTE — ED Triage Notes (Signed)
Patient c/o left ear pain that started yesterday.  

## 2017-06-30 ENCOUNTER — Encounter: Payer: Self-pay | Admitting: *Deleted

## 2017-06-30 ENCOUNTER — Ambulatory Visit
Admission: EM | Admit: 2017-06-30 | Discharge: 2017-06-30 | Disposition: A | Discharge status: Home / Self Care | Payer: Self-pay | Attending: Emergency Medicine | Admitting: Emergency Medicine

## 2017-06-30 DIAGNOSIS — H60501 Unspecified acute noninfective otitis externa, right ear: Secondary | ICD-10-CM

## 2017-06-30 DIAGNOSIS — H60502 Unspecified acute noninfective otitis externa, left ear: Secondary | ICD-10-CM

## 2017-06-30 DIAGNOSIS — H6691 Otitis media, unspecified, right ear: Secondary | ICD-10-CM

## 2017-06-30 MED ORDER — AMOXICILLIN 875 MG PO TABS: 875.0000 mg | ORAL_TABLET | Freq: Two times a day (BID) | ORAL | 0 refills | Status: DC

## 2017-06-30 MED ORDER — CIPROFLOXACIN-DEXAMETHASONE 0.3-0.1 % OT SUSP: 4.0000 [drp] | Freq: Two times a day (BID) | OTIC | 0 refills | Status: AC

## 2017-06-30 NOTE — ED Provider Notes (Signed)
[facility] ____________________________________________  Time seen: Approximately 8:56 AM  I have reviewed the triage vital signs and the nursing notes.   HISTORY  Chief Complaint Otalgia  HPI Taylor Hammond is a 36 y.o. female present for evaluation of right ear discomfort is described as mild itching and aching. Also states she would like reevaluation of her left ear. Patient reports that she was seen in urgent care this past Monday for left ear pain and was diagnosed with left otitis externa. Patient states that she's been using the antibiotic drops and left ears no longer painful. States left ear is much improved and doing better. States yesterday she began to have a some right ear complaints, and she states she did not want it to progress into similar as the left. States minimal left drainage. Denies bleeding, hearing deficits, trauma or injury. Reports otherwise feels well. Denies fevers. States has taken some over the counter advil, but states didn't need any yesterday as left had improved. Recent swimming. Denies other known trigger.   Denies chest pain, shortness of breath, abdominal pain, or rash. Denies recent sickness. Denies other recent antibiotic use.   [facility]: PCP Patient's last menstrual period was 06/25/2017 (approximate).denies pregnancy   Past Medical History:  Diagnosis Date  . Clotting disorder (HCC)   . Crohn's disease [facility])     Patient Active Problem List   Diagnosis Date Noted  . Iron deficiency anemia 07/11/2015  . CD (Crohn's disease) (HCC) 07/03/2012    Past Surgical History:  Procedure Laterality Date  . CESAREAN SECTION     X 2     No current facility-administered medications for this encounter.   Current Outpatient Prescriptions:  .  ciprofloxacin-dexamethasone (CIPRODEX) OTIC suspension, Place 4 drops into the left ear 2 (two) times daily., Disp: 7.5 mL, Rfl: 0 .  hydroxychloroquine (PLAQUENIL)  200 MG tablet, Take 200 mg by mouth daily., Disp: , Rfl:  .  rivaroxaban (XARELTO) 20 MG TABS tablet, Take 20 mg by mouth daily with supper., Disp: , Rfl:  .  amoxicillin (AMOXIL) 875 MG tablet, Take 1 tablet (875 mg total) by mouth 2 (two) times daily., Disp: 20 tablet, Rfl: 0 .  ciprofloxacin-dexamethasone (CIPRODEX) OTIC suspension, Place 4 drops into the right ear 2 (two) times daily., Disp: 7.5 mL, Rfl: 0  Allergies Codeine and Dairy aid [lactase]  History reviewed. No pertinent family history.  Social History Social History  Substance Use Topics  . Smoking status: Never Smoker  . Smokeless tobacco: Never Used  . Alcohol use 0.0 oz/week    Review of Systems Constitutional: No fever/chills ENT: No sore throat. As above.  Cardiovascular: Denies chest pain. Respiratory: Denies shortness of breath. Gastrointestinal: No abdominal pain. Musculoskeletal: Negative for back pain. Skin: Negative for rash.   ____________________________________________   PHYSICAL EXAM:  VITAL SIGNS: ED Triage Vitals  Enc Vitals Group     BP 06/30/17 0819 125/80     Pulse Rate 06/30/17 0819 81     Resp 06/30/17 0819 16     Temp 06/30/17 0819 97.8 F (36.6 C)     Temp Source 06/30/17 0819 Oral     SpO2 06/30/17 0819 99 %     Weight 06/30/17 0821 185 lb (83.9 kg)     Height 06/30/17 0821 5\' 9"  (1.753 m)     Head Circumference --      Peak Flow --      Pain Score 06/30/17 0854 0  Pain Loc --      Pain Edu? --      Excl. in GC? --     Constitutional: Alert and oriented. Well appearing and in no acute distress. Eyes: Conjunctivae are normal.  Head: Atraumatic. No sinus tenderness to palpation. No swelling. No erythema.  Ears: Left: Nontender with auricle movement, mild canal swelling and mild to moderate amount of whitish mucus drainage in the ear canal, non-erythematous TM, unable to fully visualize TM, TM appears intact. Right: Nontender with auricle movement, minimal to mild canal  swelling and erythema, no drainage, moderate TM erythema and dullness,no bulging, no drainage. No surrounding erythema, tenderness or swelling bilaterally.  Nose:No nasal congestion  Mouth/Throat: Mucous membranes are moist. No pharyngeal erythema. No swelling or exudate.  Neck: No stridor.  No cervical spine tenderness to palpation. Hematological/Lymphatic/Immunilogical: No cervical lymphadenopathy. Cardiovascular: Normal rate, regular rhythm. Grossly normal heart sounds.  Good peripheral circulation. Respiratory: Normal respiratory effort.  No retractions. No wheezes, rales or rhonchi. Good air movement.  Musculoskeletal: Ambulatory with steady gait. No cervical, thoracic or lumbar tenderness to palpation. Neurologic:  Normal speech and language. No gait instability. Skin:  Skin appears warm, dry Psychiatric: Mood and affect are normal. Speech and behavior are normal.  ___________________________________________   LABS (all labs ordered are listed, but only abnormal results are displayed)  Labs Reviewed - No data to display ____________________________________________   PROCEDURES Procedures   LEft ear drainage removed with curette, verbal consent obtained.  Exudate removed by curette manual debridement.    INITIAL IMPRESSION / ASSESSMENT AND PLAN / ED COURSE  Pertinent labs & imaging results that were available during my care of the patient were reviewed by me and considered in my medical decision making (see chart for details).  Well-appearing patient. Reports left ear has much improved with Ciprodex antibiotic. Comparing to Monday visit, left ear has improved drainage that is present and canal was removed with curette and patient tolerated well. Patient does have mild right otitis externa as well as mild right otitis media. No left otitis media noted. Patient states that she wants to avoid oral antibiotics if able and states she will take Rx for oral antibiotic and hold and start  if her ear pain continues after 2 more days. Start using Ciprodex to both ears twice daily. Discussed strict follow-up and return parameters as well as supportive care, keep them dry and monitoring.Discussed indication, risks and benefits of medications with patient.  Discussed follow up with Primary care physician this week. Discussed follow up and return parameters including no resolution or any worsening concerns. Patient verbalized understanding and agreed to plan.   ____________________________________________   FINAL CLINICAL IMPRESSION(S) / ED DIAGNOSES  Final diagnoses:  Acute otitis externa of left ear, unspecified type  Acute otitis externa of right ear, unspecified type  Right otitis media, unspecified otitis media type     Discharge Medication List as of 06/30/2017  8:50 AM    START taking these medications   Details  amoxicillin (AMOXIL) 875 MG tablet Take 1 tablet (875 mg total) by mouth 2 (two) times daily., Starting Thu 06/30/2017, Print    ciprofloxacin-dexamethasone (CIPRODEX) OTIC suspension Place 4 drops into the right ear 2 (two) times daily., Starting Thu 06/30/2017, Until Thu 07/07/2017, Normal          Note: This dictation was prepared with Dragon dictation along with smaller phrase technology. Any transcriptional errors that result from this process are unintentional.  Renford Dills, NP 06/30/17 [phone]

## 2017-06-30 NOTE — ED Triage Notes (Signed)
Right ear pain and fullness, onset last night. Seen here Monday for left ear.

## 2017-06-30 NOTE — Discharge Instructions (Signed)
Take medication as prescribed. Drink plenty of fluids. Keep dry.   Follow up with your primary care physician this week as needed. Return to Urgent care for new or worsening concerns.

## 2018-01-07 ENCOUNTER — Ambulatory Visit
Admission: EM | Admit: 2018-01-07 | Discharge: 2018-01-07 | Disposition: A | Discharge status: Home / Self Care | Payer: Self-pay | Attending: Orthopedic Surgery | Admitting: Orthopedic Surgery

## 2018-01-07 ENCOUNTER — Other Ambulatory Visit: Payer: Self-pay

## 2018-01-07 DIAGNOSIS — J020 Streptococcal pharyngitis: Secondary | ICD-10-CM

## 2018-01-07 LAB — RAPID STREP SCREEN (MED CTR MEBANE ONLY): Streptococcus, Group A Screen (Direct): POSITIVE — AB

## 2018-01-07 MED ORDER — AZITHROMYCIN 250 MG PO TABS: ORAL_TABLET | ORAL | 0 refills | Status: AC

## 2018-01-07 NOTE — Discharge Instructions (Signed)
-  azithromycin: Take 2 tablets by mouth the first day followed by one tablet daily for next 4 days. -Ibuprofen or Tylenol for pain and fever -Fluids -Rest -Can do warm salt water gargles -Follow-up with primary care provider as needed

## 2018-01-07 NOTE — ED Provider Notes (Signed)
[facility]    CSN: [id number] Arrival date & time: 01/07/18  1211     History   Chief Complaint Chief Complaint  Patient presents with  . Sore Throat    HPI Taylor Hammond is a 37 y.o. female.   Patient is a 37 year old female who presents with complaint of sore throat.  Patient states her daughter was seen and diagnosed with strep throat.  She states her initial swab was negative but came back positive on culture.  Patient states her sore throat started a couple days ago and was actually feeling okay until about 2 hours ago when she had a abrupt onset of feeling much worse.  Patient reporting chills, body aches and worsening sore throat.  Patient denies fever, chest pain, shortness of breath or cough.  Patient states she has had strep test in the past that did not respond well to penicillins but did respond to azithromycin.      Past Medical History:  Diagnosis Date  . Clotting disorder (HCC)   . Crohn's disease [facility])     Patient Active Problem List   Diagnosis Date Noted  . Iron deficiency anemia 07/11/2015  . CD (Crohn's disease) (HCC) 07/03/2012    Past Surgical History:  Procedure Laterality Date  . CESAREAN SECTION     X 2    OB History    No data available       Home Medications    Prior to Admission medications   Medication Sig Start Date End Date Taking? Authorizing Provider  azithromycin (ZITHROMAX Z-PAK) 250 MG tablet Take 2 tablets by mouth the first day followed by one tablet daily for next 4 days. 01/07/18   Candis Schatz, PA-C    Family History History reviewed. No pertinent family history.  Social History Social History   Tobacco Use  . Smoking status: Never Smoker  . Smokeless tobacco: Never Used  Substance Use Topics  . Alcohol use: Yes    Alcohol/week: 0.0 oz  . Drug use: No     Allergies   Codeine and Dairy aid [lactase]   Review of Systems Review of Systems  As noted above in HPI.  Other systems reviewed  and found to be negative   Physical Exam Triage Vital Signs ED Triage Vitals  Enc Vitals Group     BP 01/07/18 1312 115/66     Pulse Rate 01/07/18 1312 80     Resp --      Temp 01/07/18 1312 97.8 F (36.6 C)     Temp Source 01/07/18 1312 Oral     SpO2 01/07/18 1312 100 %     Weight 01/07/18 1311 185 lb (83.9 kg)     Height 01/07/18 1311 5\' 8"  (1.727 m)     Head Circumference --      Peak Flow --      Pain Score 01/07/18 1310 3     Pain Loc --      Pain Edu? --      Excl. in GC? --    No data found.  Updated Vital Signs BP 115/66 (BP Location: Left Arm)   Pulse 80   Temp 97.8 F (36.6 C) (Oral)   Ht 5\' 8"  (1.727 m)   Wt 185 lb (83.9 kg)   LMP 01/06/2018   SpO2 100%   BMI 28.13 kg/m   Visual Acuity Right Eye Distance:   Left Eye Distance:   Bilateral Distance:    Right Eye Near:  Left Eye Near:    Bilateral Near:     Physical Exam  Constitutional: She is oriented to person, place, and time. She appears well-developed and well-nourished. She appears ill.  HENT:  Head: Normocephalic and atraumatic.  Right Ear: Tympanic membrane normal.  Left Ear: Tympanic membrane normal.  Mouth/Throat: Posterior oropharyngeal erythema present. Tonsils are 1+ on the right. Tonsils are 1+ on the left.  Clear postnasal drainage  Eyes: EOM are normal. Pupils are equal, round, and reactive to light.  Neck: Normal range of motion.  Cardiovascular: Normal rate and regular rhythm.  Pulmonary/Chest: Effort normal. No respiratory distress.  Lymphadenopathy:    She has no cervical adenopathy.  Neurological: She is alert and oriented to person, place, and time.  Skin: Skin is warm and dry.  Psychiatric: She has a normal mood and affect. Her behavior is normal.     UC Treatments / Results  Labs (all labs ordered are listed, but only abnormal results are displayed) Labs Reviewed  RAPID STREP SCREEN (NOT AT [facility]) - Abnormal; Notable for the following components:      Result Value    Streptococcus, Group A Screen (Direct) POSITIVE (*)    All other components within normal limits    EKG  EKG Interpretation None       Radiology No results found.  Procedures Procedures (including critical care time)  Medications Ordered in UC Medications - No data to display   Initial Impression / Assessment and Plan / UC Course  I have reviewed the triage vital signs and the nursing notes.  Pertinent labs & imaging results that were available during my care of the patient were reviewed by me and considered in my medical decision making (see chart for details).     Patient complaint of sore throat.  Patient's daughter with positive strep based on culture.  Patient rapid strep is positive.  Based on her history we will go ahead and treat with azithromycin instead of penicillin.  Final Clinical Impressions(s) / UC Diagnoses   Final diagnoses:  Strep pharyngitis    ED Discharge Orders        Ordered    azithromycin (ZITHROMAX Z-PAK) 250 MG tablet     01/07/18 1346       Controlled Substance Prescriptions [place] Controlled Substance Registry consulted? Not Applicable   Candis Schatz, PA-C 01/07/18 1348

## 2018-01-07 NOTE — ED Triage Notes (Signed)
Patient c/o sore throat, body aches and HA x couple days. Patients daughter was diagnosed with strep.

## 2025-01-21 ENCOUNTER — Ambulatory Visit: Admitting: Internal Medicine
